# Patient Record
Sex: Male | Born: 1945 | Race: White | Hispanic: No | Marital: Single | Smoking: Former smoker
Health system: Southern US, Community
[De-identification: ages and names within clinical notes are randomized; demographics above are authoritative.]

## PROBLEM LIST (undated history)

## (undated) DIAGNOSIS — F32A Depression, unspecified: Secondary | ICD-10-CM

## (undated) DIAGNOSIS — K219 Gastro-esophageal reflux disease without esophagitis: Secondary | ICD-10-CM

## (undated) DIAGNOSIS — M1990 Unspecified osteoarthritis, unspecified site: Secondary | ICD-10-CM

## (undated) DIAGNOSIS — R0600 Dyspnea, unspecified: Secondary | ICD-10-CM

## (undated) DIAGNOSIS — I2510 Atherosclerotic heart disease of native coronary artery without angina pectoris: Secondary | ICD-10-CM

## (undated) DIAGNOSIS — G709 Myoneural disorder, unspecified: Secondary | ICD-10-CM

## (undated) DIAGNOSIS — G4730 Sleep apnea, unspecified: Secondary | ICD-10-CM

## (undated) DIAGNOSIS — J449 Chronic obstructive pulmonary disease, unspecified: Secondary | ICD-10-CM

## (undated) DIAGNOSIS — T8859XA Other complications of anesthesia, initial encounter: Secondary | ICD-10-CM

## (undated) DIAGNOSIS — F1910 Other psychoactive substance abuse, uncomplicated: Secondary | ICD-10-CM

## (undated) DIAGNOSIS — F4310 Post-traumatic stress disorder, unspecified: Secondary | ICD-10-CM

## (undated) DIAGNOSIS — E7800 Pure hypercholesterolemia, unspecified: Secondary | ICD-10-CM

## (undated) DIAGNOSIS — D649 Anemia, unspecified: Secondary | ICD-10-CM

## (undated) DIAGNOSIS — N189 Chronic kidney disease, unspecified: Secondary | ICD-10-CM

## (undated) DIAGNOSIS — C801 Malignant (primary) neoplasm, unspecified: Secondary | ICD-10-CM

## (undated) DIAGNOSIS — I10 Essential (primary) hypertension: Secondary | ICD-10-CM

## (undated) DIAGNOSIS — R413 Other amnesia: Secondary | ICD-10-CM

## (undated) HISTORY — PX: THROAT SURGERY: SHX803

## (undated) HISTORY — PX: CORONARY STENT PLACEMENT: SHX1402

## (undated) HISTORY — PX: COLONOSCOPY: SHX174

## (undated) HISTORY — PX: CORONARY ARTERY BYPASS GRAFT: SHX141

## (undated) HISTORY — PX: UPPER GI ENDOSCOPY: SHX6162

## (undated) HISTORY — PX: HAND SURGERY: SHX662

## (undated) HISTORY — PX: CARPAL TUNNEL RELEASE: SHX101

## (undated) HISTORY — PX: MOHS SURGERY: SHX181

---

## 1998-08-18 ENCOUNTER — Ambulatory Visit (HOSPITAL_COMMUNITY): Admission: RE | Admit: 1998-08-18 | Discharge: 1998-08-18 | Payer: Self-pay | Admitting: Internal Medicine

## 1998-09-05 ENCOUNTER — Ambulatory Visit (HOSPITAL_COMMUNITY): Admission: RE | Admit: 1998-09-05 | Discharge: 1998-09-05 | Payer: Self-pay | Admitting: Gastroenterology

## 1998-09-05 ENCOUNTER — Encounter: Payer: Self-pay | Admitting: Gastroenterology

## 1998-09-14 ENCOUNTER — Other Ambulatory Visit: Admission: RE | Admit: 1998-09-14 | Discharge: 1998-09-14 | Payer: Self-pay | Admitting: Gastroenterology

## 2000-12-10 DIAGNOSIS — I219 Acute myocardial infarction, unspecified: Secondary | ICD-10-CM

## 2000-12-10 HISTORY — DX: Acute myocardial infarction, unspecified: I21.9

## 2012-05-13 ENCOUNTER — Emergency Department (HOSPITAL_COMMUNITY): Payer: Non-veteran care

## 2012-05-13 ENCOUNTER — Observation Stay (HOSPITAL_COMMUNITY)
Admission: EM | Admit: 2012-05-13 | Discharge: 2012-05-14 | Disposition: A | Discharge status: Home / Self Care | Payer: Non-veteran care | Attending: Internal Medicine | Admitting: Internal Medicine

## 2012-05-13 ENCOUNTER — Encounter (HOSPITAL_COMMUNITY): Payer: Self-pay | Admitting: *Deleted

## 2012-05-13 ENCOUNTER — Observation Stay (HOSPITAL_COMMUNITY): Payer: Non-veteran care

## 2012-05-13 DIAGNOSIS — I10 Essential (primary) hypertension: Secondary | ICD-10-CM | POA: Insufficient documentation

## 2012-05-13 DIAGNOSIS — I509 Heart failure, unspecified: Secondary | ICD-10-CM | POA: Insufficient documentation

## 2012-05-13 DIAGNOSIS — R739 Hyperglycemia, unspecified: Secondary | ICD-10-CM

## 2012-05-13 DIAGNOSIS — R413 Other amnesia: Secondary | ICD-10-CM

## 2012-05-13 DIAGNOSIS — Z79899 Other long term (current) drug therapy: Secondary | ICD-10-CM | POA: Insufficient documentation

## 2012-05-13 DIAGNOSIS — Z951 Presence of aortocoronary bypass graft: Secondary | ICD-10-CM | POA: Insufficient documentation

## 2012-05-13 DIAGNOSIS — G9349 Other encephalopathy: Principal | ICD-10-CM | POA: Insufficient documentation

## 2012-05-13 DIAGNOSIS — Z7982 Long term (current) use of aspirin: Secondary | ICD-10-CM | POA: Insufficient documentation

## 2012-05-13 DIAGNOSIS — G9340 Encephalopathy, unspecified: Secondary | ICD-10-CM

## 2012-05-13 DIAGNOSIS — F17200 Nicotine dependence, unspecified, uncomplicated: Secondary | ICD-10-CM | POA: Insufficient documentation

## 2012-05-13 DIAGNOSIS — E7800 Pure hypercholesterolemia, unspecified: Secondary | ICD-10-CM | POA: Insufficient documentation

## 2012-05-13 DIAGNOSIS — E119 Type 2 diabetes mellitus without complications: Secondary | ICD-10-CM | POA: Insufficient documentation

## 2012-05-13 DIAGNOSIS — Z85828 Personal history of other malignant neoplasm of skin: Secondary | ICD-10-CM | POA: Insufficient documentation

## 2012-05-13 DIAGNOSIS — Z7902 Long term (current) use of antithrombotics/antiplatelets: Secondary | ICD-10-CM | POA: Insufficient documentation

## 2012-05-13 DIAGNOSIS — I2510 Atherosclerotic heart disease of native coronary artery without angina pectoris: Secondary | ICD-10-CM | POA: Insufficient documentation

## 2012-05-13 HISTORY — DX: Unspecified osteoarthritis, unspecified site: M19.90

## 2012-05-13 HISTORY — DX: Post-traumatic stress disorder, unspecified: F43.10

## 2012-05-13 HISTORY — DX: Malignant (primary) neoplasm, unspecified: C80.1

## 2012-05-13 HISTORY — DX: Atherosclerotic heart disease of native coronary artery without angina pectoris: I25.10

## 2012-05-13 HISTORY — DX: Essential (primary) hypertension: I10

## 2012-05-13 HISTORY — DX: Pure hypercholesterolemia, unspecified: E78.00

## 2012-05-13 LAB — COMPREHENSIVE METABOLIC PANEL
ALT: 43 U/L (ref 0–53)
AST: 25 U/L (ref 0–37)
Albumin: 3.6 g/dL (ref 3.5–5.2)
Alkaline Phosphatase: 84 U/L (ref 39–117)
CO2: 26 mEq/L (ref 19–32)
Chloride: 99 mEq/L (ref 96–112)
Creatinine, Ser: 1.19 mg/dL (ref 0.50–1.35)
GFR calc non Af Amer: 63 mL/min — ABNORMAL LOW (ref >90)
Potassium: 3.8 mEq/L (ref 3.5–5.1)
Total Bilirubin: 0.3 mg/dL (ref 0.3–1.2)

## 2012-05-13 LAB — RAPID URINE DRUG SCREEN, HOSP PERFORMED
Amphetamines: NOT DETECTED
Barbiturates: NOT DETECTED
Benzodiazepines: NOT DETECTED
Cocaine: NOT DETECTED
Tetrahydrocannabinol: NOT DETECTED

## 2012-05-13 LAB — CBC
HCT: 46.3 % (ref 39.0–52.0)
Hemoglobin: 16 g/dL (ref 13.0–17.0)
MCHC: 34.6 g/dL (ref 30.0–36.0)
RDW: 13.1 % (ref 11.5–15.5)
WBC: 4.8 10*3/uL (ref 4.0–10.5)

## 2012-05-13 LAB — URINALYSIS, ROUTINE W REFLEX MICROSCOPIC
Bilirubin Urine: NEGATIVE
Glucose, UA: 250 mg/dL — AB
Hgb urine dipstick: NEGATIVE
Ketones, ur: NEGATIVE mg/dL
Protein, ur: NEGATIVE mg/dL
Urobilinogen, UA: 1 mg/dL (ref 0.0–1.0)

## 2012-05-13 LAB — DIFFERENTIAL
Basophils Absolute: 0 10*3/uL (ref 0.0–0.1)
Basophils Relative: 0 % (ref 0–1)
Lymphocytes Relative: 24 % (ref 12–46)
Monocytes Absolute: 0.4 10*3/uL (ref 0.1–1.0)
Neutro Abs: 3.2 10*3/uL (ref 1.7–7.7)
Neutrophils Relative %: 66 % (ref 43–77)

## 2012-05-13 LAB — GLUCOSE, CAPILLARY
Glucose-Capillary: 256 mg/dL — ABNORMAL HIGH (ref 70–99)
Glucose-Capillary: 175 mg/dL — ABNORMAL HIGH (ref 70–99)

## 2012-05-13 LAB — POCT I-STAT TROPONIN I: Troponin i, poc: 0 ng/mL (ref 0.00–0.08)

## 2012-05-13 LAB — ETHANOL: Alcohol, Ethyl (B): <11 mg/dL (ref 0–11)

## 2012-05-13 MED ORDER — FUROSEMIDE 40 MG PO TABS
40.0000 mg | ORAL_TABLET | Freq: Every day | ORAL | Status: DC
  Administered 2012-05-14: 40 mg via ORAL
  Filled 2012-05-13: qty 1

## 2012-05-13 MED ORDER — TIOTROPIUM BROMIDE MONOHYDRATE 18 MCG IN CAPS
18.0000 ug | ORAL_CAPSULE | Freq: Every day | RESPIRATORY_TRACT | Status: DC
  Administered 2012-05-14: 18 ug via RESPIRATORY_TRACT
  Filled 2012-05-13: qty 5

## 2012-05-13 MED ORDER — ALBUTEROL SULFATE HFA 108 (90 BASE) MCG/ACT IN AERS
2.0000 | INHALATION_SPRAY | Freq: Four times a day (QID) | RESPIRATORY_TRACT | Status: DC | PRN
  Filled 2012-05-13: qty 6.7

## 2012-05-13 MED ORDER — PANTOPRAZOLE SODIUM 40 MG PO TBEC
40.0000 mg | DELAYED_RELEASE_TABLET | Freq: Every day | ORAL | Status: DC
  Administered 2012-05-14: 40 mg via ORAL
  Filled 2012-05-13: qty 1

## 2012-05-13 MED ORDER — ENOXAPARIN SODIUM 40 MG/0.4ML ~~LOC~~ SOLN
40.0000 mg | SUBCUTANEOUS | Status: DC
  Administered 2012-05-13: 40 mg via SUBCUTANEOUS
  Filled 2012-05-13 (×2): qty 0.4

## 2012-05-13 MED ORDER — CITALOPRAM HYDROBROMIDE 40 MG PO TABS
40.0000 mg | ORAL_TABLET | Freq: Every day | ORAL | Status: DC
  Administered 2012-05-14: 40 mg via ORAL
  Filled 2012-05-13: qty 1

## 2012-05-13 MED ORDER — SODIUM CHLORIDE 0.9 % IV SOLN: 250.0000 mL | INTRAVENOUS | Status: DC | PRN

## 2012-05-13 MED ORDER — SODIUM CHLORIDE 0.9 % IJ SOLN
3.0000 mL | Freq: Two times a day (BID) | INTRAMUSCULAR | Status: DC
  Administered 2012-05-13 – 2012-05-14 (×2): 3 mL via INTRAVENOUS

## 2012-05-13 MED ORDER — INSULIN GLARGINE 100 UNIT/ML ~~LOC~~ SOLN
80.0000 [IU] | Freq: Every day | SUBCUTANEOUS | Status: DC
  Administered 2012-05-13: 80 [IU] via SUBCUTANEOUS

## 2012-05-13 MED ORDER — ASPIRIN 81 MG PO CHEW
81.0000 mg | CHEWABLE_TABLET | Freq: Every day | ORAL | Status: DC
  Administered 2012-05-14: 81 mg via ORAL
  Filled 2012-05-13: qty 1

## 2012-05-13 MED ORDER — PRAZOSIN HCL 5 MG PO CAPS
6.0000 mg | ORAL_CAPSULE | Freq: Every day | ORAL | Status: DC
  Administered 2012-05-13: 6 mg via ORAL
  Filled 2012-05-13 (×2): qty 1

## 2012-05-13 MED ORDER — ISOSORBIDE MONONITRATE ER 30 MG PO TB24
30.0000 mg | ORAL_TABLET | Freq: Every day | ORAL | Status: DC
  Administered 2012-05-14: 30 mg via ORAL
  Filled 2012-05-13: qty 1

## 2012-05-13 MED ORDER — ACETAMINOPHEN 325 MG PO TABS
650.0000 mg | ORAL_TABLET | Freq: Once | ORAL | Status: AC
  Administered 2012-05-13: 650 mg via ORAL
  Filled 2012-05-13: qty 2

## 2012-05-13 MED ORDER — ADULT MULTIVITAMIN W/MINERALS CH
1.0000 | ORAL_TABLET | Freq: Every day | ORAL | Status: DC
  Administered 2012-05-14: 1 via ORAL
  Filled 2012-05-13: qty 1

## 2012-05-13 MED ORDER — LORATADINE 10 MG PO TABS
10.0000 mg | ORAL_TABLET | Freq: Every day | ORAL | Status: DC
  Administered 2012-05-14: 10 mg via ORAL
  Filled 2012-05-13: qty 1

## 2012-05-13 MED ORDER — ONDANSETRON HCL 4 MG/2ML IJ SOLN: 4.0000 mg | Freq: Four times a day (QID) | INTRAMUSCULAR | Status: DC | PRN

## 2012-05-13 MED ORDER — ATORVASTATIN CALCIUM 40 MG PO TABS
40.0000 mg | ORAL_TABLET | Freq: Every evening | ORAL | Status: DC
  Administered 2012-05-13: 40 mg via ORAL
  Filled 2012-05-13 (×2): qty 1

## 2012-05-13 MED ORDER — ACETAMINOPHEN 325 MG PO TABS: 650.0000 mg | ORAL_TABLET | ORAL | Status: DC | PRN

## 2012-05-13 MED ORDER — INSULIN ASPART 100 UNIT/ML ~~LOC~~ SOLN
0.0000 [IU] | Freq: Three times a day (TID) | SUBCUTANEOUS | Status: DC
  Administered 2012-05-14: 5 [IU] via SUBCUTANEOUS
  Administered 2012-05-14: 3 [IU] via SUBCUTANEOUS

## 2012-05-13 MED ORDER — CLOPIDOGREL BISULFATE 75 MG PO TABS
75.0000 mg | ORAL_TABLET | Freq: Every day | ORAL | Status: DC
  Administered 2012-05-14: 75 mg via ORAL
  Filled 2012-05-13 (×2): qty 1

## 2012-05-13 MED ORDER — METOPROLOL TARTRATE 12.5 MG HALF TABLET
12.5000 mg | ORAL_TABLET | Freq: Two times a day (BID) | ORAL | Status: DC
  Administered 2012-05-13 – 2012-05-14 (×2): 12.5 mg via ORAL
  Filled 2012-05-13 (×3): qty 1

## 2012-05-13 MED ORDER — SODIUM CHLORIDE 0.9 % IJ SOLN: 3.0000 mL | INTRAMUSCULAR | Status: DC | PRN

## 2012-05-13 MED ORDER — BUPROPION HCL ER (SR) 100 MG PO TB12
200.0000 mg | ORAL_TABLET | Freq: Two times a day (BID) | ORAL | Status: DC
  Administered 2012-05-13 – 2012-05-14 (×2): 200 mg via ORAL
  Filled 2012-05-13 (×3): qty 2

## 2012-05-13 MED ORDER — NITROGLYCERIN 0.4 MG SL SUBL: 0.4000 mg | SUBLINGUAL_TABLET | SUBLINGUAL | Status: DC | PRN
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
## 2012-05-13 NOTE — ED Notes (Signed)
CBG 256  °

## 2012-05-13 NOTE — ED Notes (Signed)
Taty[place], PA at bedside. 
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
## 2012-05-13 NOTE — ED Notes (Signed)
Patient transported to CT 

## 2012-05-14 ENCOUNTER — Encounter (HOSPITAL_COMMUNITY): Payer: Self-pay | Admitting: *Deleted

## 2012-05-14 DIAGNOSIS — G459 Transient cerebral ischemic attack, unspecified: Secondary | ICD-10-CM

## 2012-05-14 DIAGNOSIS — R413 Other amnesia: Secondary | ICD-10-CM

## 2012-05-14 DIAGNOSIS — I10 Essential (primary) hypertension: Secondary | ICD-10-CM

## 2012-05-14 DIAGNOSIS — G9340 Encephalopathy, unspecified: Secondary | ICD-10-CM

## 2012-05-14 LAB — LIPID PANEL
Cholesterol: 117 mg/dL (ref 0–200)
HDL: 31 mg/dL — ABNORMAL LOW (ref >39)
Total CHOL/HDL Ratio: 3.8 RATIO

## 2012-05-14 LAB — HEMOGLOBIN A1C
Hgb A1c MFr Bld: 7.4 % — ABNORMAL HIGH (ref <5.7)
Mean Plasma Glucose: 166 mg/dL — ABNORMAL HIGH (ref <117)

## 2012-05-14 LAB — GLUCOSE, CAPILLARY: Glucose-Capillary: 183 mg/dL — ABNORMAL HIGH (ref 70–99)

## 2012-05-14 NOTE — Progress Notes (Signed)
Pt's daughter called to confirm that pt did indeed take his PM medications this am. Ambien was apart of the nighttime medications that he took this morning per pt's daughter. Will continue to monitor pt.

## 2012-05-14 NOTE — Progress Notes (Signed)
  Echocardiogram 2D Echocardiogram has been performed.  Micheal Duncan A 05/14/2012, 2:24 PM

## 2012-05-14 NOTE — Care Management Note (Unsigned)
    Page 1 of 1   05/14/2012     2:12:17 PM   CARE MANAGEMENT NOTE 05/14/2012  Patient:  Micheal Duncan, Micheal Duncan   Account Number:  [id number]  Date Initiated:  05/14/2012  Documentation initiated by:  Lanier Clam  Subjective/Objective Assessment:   ADMITTED W/AMNESIA.     Action/Plan:   FROM HOME   Anticipated DC Date:  05/15/2012   Anticipated DC Plan:  HOME/SELF CARE      DC Planning Services  CM consult      Choice offered to / List presented to:             Status of service:  In process, will continue to follow Medicare Important Message given?   (If response is "NO", the following Medicare IM given date fields will be blank) Date Medicare IM given:   Date Additional Medicare IM given:    Discharge Disposition:    Per UR Regulation:  Reviewed for med. necessity/level of care/duration of stay  If discussed at Long Length of Stay Meetings, dates discussed:    Comments:  05/14/12 [facility] RN,BSN NCM [phone]
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
## 2012-05-14 NOTE — Progress Notes (Signed)
VASCULAR LAB PRELIMINARY  PRELIMINARY  PRELIMINARY  PRELIMINARY  Carotid duplex completed.    Preliminary report: Bilateral:  No evidence of hemodynamically significant internal carotid artery stenosis.   Vertebral artery flow is antegrade.     Terance Hart, RVT 05/14/2012, 1:44 PM

## 2012-05-14 NOTE — Progress Notes (Signed)
Patient discharged.

## 2012-05-17 NOTE — ED Provider Notes (Signed)
Medical screening examination/treatment/procedure(s) were conducted as a shared visit with non-physician practitioner(s) and myself.  I personally evaluated the patient during the encounter This male presents following a questionable syncopal episode.  The patient's history is difficult to interpret, and may have included either a syncopal or a sleeping episode.  Given these concerns, the absence of recent prior evaluation, the patient was admitted for further evaluation and management.  I have seen the ECG and agree with the interpretation  Gerhard Munch, MD 05/17/12 1550

## 2013-01-16 ENCOUNTER — Emergency Department (HOSPITAL_COMMUNITY): Payer: Non-veteran care

## 2013-01-16 ENCOUNTER — Emergency Department (HOSPITAL_COMMUNITY)
Admission: EM | Admit: 2013-01-16 | Discharge: 2013-01-16 | Disposition: A | Discharge status: Home / Self Care | Payer: Non-veteran care | Attending: Emergency Medicine | Admitting: Emergency Medicine

## 2013-01-16 ENCOUNTER — Encounter (HOSPITAL_COMMUNITY): Payer: Self-pay | Admitting: Emergency Medicine

## 2013-01-16 DIAGNOSIS — Z951 Presence of aortocoronary bypass graft: Secondary | ICD-10-CM | POA: Diagnosis not present

## 2013-01-16 DIAGNOSIS — I10 Essential (primary) hypertension: Secondary | ICD-10-CM | POA: Diagnosis not present

## 2013-01-16 DIAGNOSIS — Z85820 Personal history of malignant melanoma of skin: Secondary | ICD-10-CM | POA: Insufficient documentation

## 2013-01-16 DIAGNOSIS — R112 Nausea with vomiting, unspecified: Secondary | ICD-10-CM | POA: Insufficient documentation

## 2013-01-16 DIAGNOSIS — R799 Abnormal finding of blood chemistry, unspecified: Secondary | ICD-10-CM | POA: Diagnosis not present

## 2013-01-16 DIAGNOSIS — Z7902 Long term (current) use of antithrombotics/antiplatelets: Secondary | ICD-10-CM | POA: Diagnosis not present

## 2013-01-16 DIAGNOSIS — R197 Diarrhea, unspecified: Secondary | ICD-10-CM | POA: Insufficient documentation

## 2013-01-16 DIAGNOSIS — I2510 Atherosclerotic heart disease of native coronary artery without angina pectoris: Secondary | ICD-10-CM | POA: Insufficient documentation

## 2013-01-16 DIAGNOSIS — R1012 Left upper quadrant pain: Secondary | ICD-10-CM | POA: Insufficient documentation

## 2013-01-16 DIAGNOSIS — Z794 Long term (current) use of insulin: Secondary | ICD-10-CM | POA: Diagnosis not present

## 2013-01-16 DIAGNOSIS — Z7982 Long term (current) use of aspirin: Secondary | ICD-10-CM | POA: Diagnosis not present

## 2013-01-16 DIAGNOSIS — Z8739 Personal history of other diseases of the musculoskeletal system and connective tissue: Secondary | ICD-10-CM | POA: Insufficient documentation

## 2013-01-16 DIAGNOSIS — R109 Unspecified abdominal pain: Secondary | ICD-10-CM

## 2013-01-16 DIAGNOSIS — F4310 Post-traumatic stress disorder, unspecified: Secondary | ICD-10-CM | POA: Insufficient documentation

## 2013-01-16 DIAGNOSIS — F17200 Nicotine dependence, unspecified, uncomplicated: Secondary | ICD-10-CM | POA: Insufficient documentation

## 2013-01-16 DIAGNOSIS — Z79899 Other long term (current) drug therapy: Secondary | ICD-10-CM | POA: Insufficient documentation

## 2013-01-16 DIAGNOSIS — E119 Type 2 diabetes mellitus without complications: Secondary | ICD-10-CM | POA: Insufficient documentation

## 2013-01-16 DIAGNOSIS — Z9861 Coronary angioplasty status: Secondary | ICD-10-CM | POA: Diagnosis not present

## 2013-01-16 DIAGNOSIS — E7800 Pure hypercholesterolemia, unspecified: Secondary | ICD-10-CM | POA: Insufficient documentation

## 2013-01-16 LAB — CBC WITH DIFFERENTIAL/PLATELET
Eosinophils Absolute: 0.1 10*3/uL (ref 0.0–0.7)
Eosinophils Relative: 1 % (ref 0–5)
Lymphs Abs: 0.2 10*3/uL — ABNORMAL LOW (ref 0.7–4.0)
MCH: 31.5 pg (ref 26.0–34.0)
MCV: 89.7 fL (ref 78.0–100.0)
Platelets: 120 10*3/uL — ABNORMAL LOW (ref 150–400)
RBC: 5.55 MIL/uL (ref 4.22–5.81)
RDW: 13.6 % (ref 11.5–15.5)

## 2013-01-16 LAB — URINALYSIS, ROUTINE W REFLEX MICROSCOPIC
Leukocytes, UA: NEGATIVE
Nitrite: NEGATIVE
Protein, ur: NEGATIVE mg/dL
Urobilinogen, UA: 1 mg/dL (ref 0.0–1.0)

## 2013-01-16 LAB — COMPREHENSIVE METABOLIC PANEL
ALT: 43 U/L (ref 0–53)
BUN: 21 mg/dL (ref 6–23)
Calcium: 8.6 mg/dL (ref 8.4–10.5)
GFR calc Af Amer: 78 mL/min — ABNORMAL LOW (ref >90)
Glucose, Bld: 193 mg/dL — ABNORMAL HIGH (ref 70–99)
Sodium: 135 mEq/L (ref 135–145)
Total Protein: 6.6 g/dL (ref 6.0–8.3)

## 2013-01-16 LAB — AMYLASE: Amylase: 63 U/L (ref 0–105)

## 2013-01-16 LAB — PRO B NATRIURETIC PEPTIDE: Pro B Natriuretic peptide (BNP): 94.7 pg/mL (ref 0–125)

## 2013-01-16 LAB — LIPASE, BLOOD: Lipase: 33 U/L (ref 11–59)

## 2013-01-16 MED ORDER — SUCRALFATE 1 GM/10ML PO SUSP: 1.0000 g | Freq: Four times a day (QID) | ORAL | Status: DC

## 2013-01-16 MED ORDER — ONDANSETRON HCL 4 MG/2ML IJ SOLN
4.0000 mg | Freq: Once | INTRAMUSCULAR | Status: AC
  Administered 2013-01-16: 4 mg via INTRAVENOUS
  Filled 2013-01-16: qty 2

## 2013-01-16 MED ORDER — IOHEXOL 300 MG/ML  SOLN
100.0000 mL | Freq: Once | INTRAMUSCULAR | Status: AC | PRN
  Administered 2013-01-16: 100 mL via INTRAVENOUS

## 2013-01-16 MED ORDER — HYDROMORPHONE HCL PF 1 MG/ML IJ SOLN
1.0000 mg | Freq: Once | INTRAMUSCULAR | Status: AC
  Administered 2013-01-16: 1 mg via INTRAVENOUS
  Filled 2013-01-16: qty 1

## 2013-01-16 MED ORDER — IOHEXOL 300 MG/ML  SOLN
50.0000 mL | Freq: Once | INTRAMUSCULAR | Status: AC | PRN
  Administered 2013-01-16: 50 mL via ORAL

## 2013-01-16 NOTE — ED Provider Notes (Signed)
History     CSN: [id number]  Arrival date & time 01/16/13  1759   First MD Initiated Contact with Patient 01/16/13 1937      Chief Complaint  Patient presents with  . Abdominal Pain  . Abnormal Lab    (Consider location/radiation/quality/duration/timing/severity/associated sxs/prior treatment) HPI The patient presents after an one day of significant nausea, vomiting.  He states that over the past weeks he has developed increasing pain in his abdomen, focally about the left upper quadrant, with mild diffuse radiation.  The pain is worse with positioning.  Over the past 2 weeks he is developed nausea, the no significant emesis until today.  Today the patient had multiple episodes of copious vomiting, non-bloody.  He also had several episodes of diarrhea today. The patient denies fevers, chills, confusion, disorientation.  He notes that his abdomen has become increasingly large over the past few months. The patient receives primary care at a [facility], has no gastroenterologist locally. He also complains of ongoing fecal incontinence post defecation.   Past Medical History  Diagnosis Date  . Coronary artery disease   . Diabetes mellitus   . Hypertension   . Hypercholesteremia   . Arthritis   . PTSD (post-traumatic stress disorder)   . 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 Coronary artery bypass graft   . Coronary stent placement   . Hand surgery     for skin cancer  . Throat surgery   . Carpal tunnel release     right    Family History  Problem Relation Age of Onset  . Aneurysm Father     History  Substance Use Topics  . Smoking status: Current Every Day Smoker -- 0.5 packs/day for 45 years  . Smokeless tobacco: Never Used  . Alcohol Use: No      Review of Systems  Constitutional:       Per HPI, otherwise negative  HENT:       Per HPI, otherwise negative  Eyes: Negative.   Respiratory:       Per HPI,  otherwise negative  Cardiovascular:       Per HPI, otherwise negative  Gastrointestinal: Positive for nausea, vomiting, abdominal pain, diarrhea and abdominal distention. Negative for constipation, blood in stool, anal bleeding and rectal pain.  Genitourinary: Negative.   Musculoskeletal:       Per HPI, otherwise negative  Skin: Negative for rash and wound.  Neurological: Negative for syncope.    Allergies  Fentanyl  Home Medications   Current Outpatient Rx  Name  Route  Sig  Dispense  Refill  . ALBUTEROL SULFATE HFA 108 (90 BASE) MCG/ACT IN AERS   Inhalation   Inhale 2 puffs into the lungs 3 (three) times daily as needed. For shortness of breath         . ASPIRIN 81 MG PO CHEW   Oral   Chew 81 mg by mouth daily.         . ATORVASTATIN CALCIUM 40 MG PO TABS   Oral   Take 20 mg by mouth daily.          . CHLORHEXIDINE GLUCONATE 4 % EX LIQD   Topical   Apply 1 application topically daily as needed. For sore prevention         . CITALOPRAM HYDROBROMIDE 40 MG PO TABS   Oral   Take 20 mg by mouth daily.          Marland Kitchen  CLOPIDOGREL BISULFATE 75 MG PO TABS   Oral   Take 75 mg by mouth daily.         . FUROSEMIDE 40 MG PO TABS   Oral   Take 40 mg by mouth daily.         Marland Kitchen HYDROCORTISONE 2.5 % EX LOTN   Topical   Apply 1 application topically 2 (two) times daily as needed. Apply topically to left nipple to reduce inflammation         . INSULIN GLARGINE 100 UNIT/ML [place] SOLN   Subcutaneous   Inject 85 Units into the skin at bedtime.          . INSULIN REGULAR HUMAN 100 UNIT/ML IJ SOLN   Subcutaneous   Inject 46 Units into the skin 3 (three) times daily before meals.          . ISOSORBIDE MONONITRATE ER 30 MG PO TB24   Oral   Take 30 mg by mouth daily.         Marland Kitchen LISINOPRIL 10 MG PO TABS   Oral   Take 5 mg by mouth daily.          Marland Kitchen LORATADINE 10 MG PO TABS   Oral   Take 10 mg by mouth daily.         Marland Kitchen METOPROLOL TARTRATE 25 MG PO TABS    Oral   Take 12.5 mg by mouth 2 (two) times daily.         . ADULT MULTIVITAMIN W/MINERALS CH   Oral   Take 1 tablet by mouth daily.         Marland Kitchen NAPROXEN SODIUM 220 MG PO TABS   Oral   Take 440 mg by mouth daily as needed. For pain         . NITROGLYCERIN 0.4 MG SL SUBL   Sublingual   Place 0.4 mg under the tongue every 5 (five) minutes as needed. For chest pain         . OMEPRAZOLE 20 MG PO CPDR   Oral   Take 40 mg by mouth daily.         Marland Kitchen PRAZOSIN HCL 2 MG PO CAPS   Oral   Take 6 mg by mouth at bedtime.         Marland Kitchen TIOTROPIUM BROMIDE MONOHYDRATE 18 MCG IN CAPS   Inhalation   Place 18 mcg into inhaler and inhale daily as needed. For shortness of breath           BP 139/74  Pulse 101  Temp 98.3 F (36.8 C) (Oral)  Resp 22  SpO2 96%  Physical Exam  Nursing note and vitals reviewed. Constitutional: He is oriented to person, place, and time. He appears well-developed. No distress.  HENT:  Head: Normocephalic and atraumatic.  Eyes: Conjunctivae normal and EOM are normal.  Cardiovascular: Regular rhythm.  Tachycardia present.   Pulmonary/Chest: Effort normal. No stridor. No respiratory distress.  Abdominal: He exhibits no distension.       Protuberant abdomen, with minimal tenderness to palpation. There are multiple superficial scars, consistent with removal of superficial neoplasms  Musculoskeletal: He exhibits no edema.  Neurological: He is alert and oriented to person, place, and time.  Skin: Skin is warm and dry.  Psychiatric: He has a normal mood and affect.    ED Course  Procedures (including critical care time)  Labs Reviewed  CBC WITH DIFFERENTIAL - Abnormal; Notable for the following:    Hemoglobin  17.5 (*)     Platelets 120 (*)     Neutrophils Relative 89 (*)     Lymphocytes Relative 3 (*)     Lymphs Abs 0.2 (*)     All other components within normal limits  COMPREHENSIVE METABOLIC PANEL - Abnormal; Notable for the following:    Glucose,  Bld 193 (*)     GFR calc non Af Amer 67 (*)     GFR calc Af Amer 78 (*)     All other components within normal limits  LIPASE, BLOOD  AMYLASE  URINALYSIS, ROUTINE W REFLEX MICROSCOPIC  PRO B NATRIURETIC PEPTIDE   No results found.   No diagnosis found.  O2- 99%ra, normal  Update: Patient substantially better, no additional vomiting   Update: Stoma vomiting, patient appears calm, in no distress  I discussed all results with the patient and his daughter.  I reviewed prior imaging, including EGD, colonoscopy, labs from outside hospital. MDM  This patient presents with ongoing abdominal pain, and after an episode of nausea with vomiting.  On exam the patient is uncomfortable initially, but improved substantially.  Vital signs remained stable, and his labs, CAT scan did not demonstrate acute findings, but there are multiple abnormalities which were discussed with the patient and his family.  The patient is already arranging gastroenterology followup, and we'll have this within the next week.  There are multiple smooth forceps of nausea vomiting, with primary suspicion for gastric irritation, though biliary or ureteral spasm are considerations.        Gerhard Munch, MD 01/16/13 [phone]

## 2013-01-16 NOTE — ED Notes (Signed)
Pt sent here for PCP for increased pancreas enzymes; pt sts LUQ pain x months; pt sts worse with positioning; pt sts vomiting today

## 2013-01-16 NOTE — ED Notes (Signed)
Started drinking his contrast.

## 2013-01-16 NOTE — ED Notes (Signed)
Contrast completed.

## 2013-02-10 ENCOUNTER — Telehealth: Payer: Self-pay | Admitting: Internal Medicine

## 2013-02-10 NOTE — Telephone Encounter (Signed)
Pt dropped off his records from the [facility] to be reviewed. Both Dr. Rhea Belton and Dr. Marina Goodell declined to see the patient at this time. Called pt and informed him of the decision and he said he would come by the office to pickup his medical records.

## 2013-03-24 ENCOUNTER — Ambulatory Visit (INDEPENDENT_AMBULATORY_CARE_PROVIDER_SITE_OTHER): Payer: Medicare Other | Admitting: General Surgery

## 2013-03-24 ENCOUNTER — Encounter (INDEPENDENT_AMBULATORY_CARE_PROVIDER_SITE_OTHER): Payer: Self-pay | Admitting: General Surgery

## 2013-03-24 VITALS — BP 124/72 | HR 64 | Temp 97.3°F | Resp 16 | Ht 68.0 in | Wt 269.0 lb

## 2013-03-24 DIAGNOSIS — R159 Full incontinence of feces: Secondary | ICD-10-CM

## 2013-03-24 NOTE — Patient Instructions (Addendum)
Continue your fiber supplement twice a day.  Stop all stool softeners and laxatives.  Work on relaxing your pelvic muscles when you are trying to have a bowel movement.  Do not strain.      Bowel Incontinence  What is incontinence? Incontinence is the impaired ability to control gas or stool. Its severity ranges from mild difficulty with gas control to severe loss of control over liquid and formed stools. Incontinence to stool is a common problem, but often it is not discussed due to embarassment. What causes incontinence? There are many causes of incontinence. Injury during childbirth is one of the most common causes. These injuries may cause a tear in the anal muscles. The nerves supplying the anal muscles may also be injured. While some injuries may be recognized immediately following childbirth, many others may go unnoticed and not become a problem until later in life. In these situations, a prior childbirth may not be recognized as the cause of incontinence. Anal operations or traumatic injury to the tissue surrounding the anal region similarly can damage the anal muscles and hinder bowel control. Some individuals experience loss of strength in the anal muscles as they age. As a result, a minor control problem in a younger person may become more significant later in life. Diarrhea may be associated with a feeling of urgency or stool leakage due to the frequent liquid stools passing through the anal opening. If bleeding accompanies your bowel movements or you have lack of bowel control, consult your physician. These symptoms may indicate inflammation within the colon (colitis), a rectal tumor or rectal prolapse - all conditions that require prompt evaluation by a physician.    How is the cause of incontinence determined? An initial discussion of the problem with your physician will help establish the degree of control difficulty and its impact on your lifestyle. Many clues to the origin of  incontinence may be found in patient histories. For example, a woman's history of past childbirths is very important. Multiple pregnancies, large weight babies, forceps deliveries, or episiotomies may contribute to muscle or nerve injury at the time of childbirth. In some cases, medical illnesses and medications play a role in problems with control. A physical exam of the anal region should be performed. It may readily identify an obvious injury to the anal muscles. In addition, an ultrasound probe can be used within the anal area to provide a picture of the muscles and show areas in which the anal muscles have been injured. Frequently, additional studies are required to define the anal area more completely. In a test called anal manometry, a small catheter is placed into the anus to record pressure as patients relax and tighten the anal muscles. This test can demonstrate how weak or strong the muscle really is. A separate test may also be conducted to determine if the nerves that go to the anal muscles are functioning properly. What can be done to correct the problem? Treatment of incontinence may include: Marland Kitchen     Dietary changes .     Constipating medications .     Muscle strengthening exercises .     Biofeedback  Injectable bulking agents  Surgical muscle repair  Artificial anal sphincter  Sacral nerve stimulator  After a careful history, physical examination and testing to determine the cause and severity of the problem, treatment can be addressed. Mild problems may be treated very simply with dietary changes and the use of some constipating medications. Diseases which cause inflammation in the  rectum, such as colitis, may contribute to anal control problems. Treating these diseases also may eliminate or improve symptoms of incontinence. Sometimes a change in prescribed medications may help. Your physician also may recommend simple home exercises that may strengthen the anal muscles to help in  mild cases. A type of physical therapy called biofeedback can be used to help patients sense when stool is ready to be evacuated and help strengthen the muscles. Injuries to the anal muscles may be repaired with surgery. Some individuals may benefit from a technique that delivers electrical energy to the skin and muscles surrounding the anus which results in firming and thickening of this area to help with continence. In certain individuals that have nerve damage or anal muscles that are damaged beyond repair, an artificial sphincter may be implanted. The artificial sphincter is a plastic, fluid filled doughnut that is surgically implanted around the damaged anal sphincter. This artificial sphincter keeps the anal canal closed. When an individual wants to have a bowel movement, the fluid can be pumped out of the doughnut to allow the anal canal to open. In extreme cases, patients may find that a colostomy is the best option for improving their quality of life. What is a colon and rectal surgeon? Colon and rectal surgeons are experts in the surgical and non-surgical treatment of diseases of the colon, rectum and anus. They have completed advanced surgical training in the treatment of these diseases as well as full general surgical training. Board-certified colon and rectal surgeons complete residencies in general surgery and colon and rectal surgery, and pass intensive examinations conducted by the American Board of Surgery and the American Board of Colon and Rectal Surgery. They are well-versed in the treatment of both benign and malignant diseases of the colon, rectum and anus and are able to perform routine screening examinations and surgically treat conditions if indicated to do so.  2012 American Society of Colon & Rectal Surgeons

## 2013-03-24 NOTE — Progress Notes (Signed)
Chief Complaint  Patient presents with  . New Evaluation    eval of fecal incontinence    HISTORY: Micheal Duncan is a 67 y.o. male who presents to the office with fecal leakage for about a year and a half.  It is gradually getting worse.   He has tried Stool softeners  in the past with no success.  Diarrhea makes the symptoms worse.   It is intermittent in nature.  His bowel habits are regular and his bowel movements are soft.  His fiber intake is through a supplement.  His last colonoscopy was recently and a few polyps were removed.   He is continent to gas.  He has a feeling of incomplete evacuation.   Past Medical History  Diagnosis Date  . Coronary artery disease   . Diabetes mellitus   . Hypertension   . Hypercholesteremia   . Arthritis   . PTSD (post-traumatic stress disorder)   . 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 Coronary artery bypass graft    . Coronary stent placement    . Hand surgery      for skin cancer  . Throat surgery    . Carpal tunnel release      right        Current Outpatient Prescriptions  Medication Sig Dispense Refill  . albuterol (PROVENTIL HFA;VENTOLIN HFA) 108 (90 BASE) MCG/ACT inhaler Inhale 2 puffs into the lungs 3 (three) times daily as needed. For shortness of breath      . aspirin 81 MG chewable tablet Chew 81 mg by mouth daily.      Marland Kitchen atorvastatin (LIPITOR) 40 MG tablet Take 20 mg by mouth daily.       . chlorhexidine (HIBICLENS) 4 % external liquid Apply 1 application topically daily as needed. For sore prevention      . citalopram (CELEXA) 40 MG tablet Take 20 mg by mouth daily.       . clopidogrel (PLAVIX) 75 MG tablet Take 75 mg by mouth daily.      . furosemide (LASIX) 40 MG tablet Take 40 mg by mouth daily.      . hydrocortisone 2.5 % lotion Apply 1 application topically 2 (two) times daily as needed. Apply topically to left nipple to reduce inflammation      . insulin  glargine (LANTUS) 100 UNIT/ML injection Inject 85 Units into the skin at bedtime.       . insulin regular (NOVOLIN R,HUMULIN R) 100 units/mL injection Inject 46 Units into the skin 3 (three) times daily before meals.       . isosorbide mononitrate (IMDUR) 30 MG 24 hr tablet Take 30 mg by mouth daily.      Marland Kitchen lisinopril (PRINIVIL,ZESTRIL) 10 MG tablet Take 5 mg by mouth daily.       Marland Kitchen loratadine (CLARITIN) 10 MG tablet Take 10 mg by mouth daily.      . metoprolol tartrate (LOPRESSOR) 25 MG tablet Take 12.5 mg by mouth 2 (two) times daily.      . Multiple Vitamin (MULITIVITAMIN WITH MINERALS) TABS Take 1 tablet by mouth daily.      . naproxen sodium (ANAPROX) 220 MG tablet Take 440 mg by mouth daily as needed. For pain      . nitroGLYCERIN (NITROSTAT) 0.4 MG SL tablet Place 0.4 mg under the tongue every 5 (five) minutes as needed. For  chest pain      . omeprazole (PRILOSEC) 20 MG capsule Take 40 mg by mouth daily.      . prazosin (MINIPRESS) 2 MG capsule Take 6 mg by mouth at bedtime.      . sucralfate (CARAFATE) 1 GM/10ML suspension Take 10 mLs (1 g total) by mouth 4 (four) times daily.  420 mL  0  . tiotropium (SPIRIVA) 18 MCG inhalation capsule Place 18 mcg into inhaler and inhale daily as needed. For shortness of breath       No current facility-administered medications for this visit.      Allergies  Allergen Reactions  . Fentanyl Itching    Confusion, "skin crawling"      Family History  Problem Relation Age of Onset  . Aneurysm Father   . Diabetes Mother   . Cancer Maternal Uncle     unknown to pt  . Cancer Maternal Grandmother     ?stomach  . Cancer Sister     ?stomach  . Cancer Maternal Uncle     unknown to pt  . Cancer Maternal Uncle     unknown to pt    History   Social History  . Marital Status: Single    Spouse Name: N/A    Number of Children: N/A  . Years of Education: N/A   Social History Main Topics  . Smoking status: Current Every Day Smoker -- 0.50  packs/day for 45 years  . Smokeless tobacco: Never Used  . Alcohol Use: No  . Drug Use: No  . Sexually Active: None   Other Topics Concern  . None   Social History Narrative  . None      REVIEW OF SYSTEMS - PERTINENT POSITIVES ONLY: Review of Systems - General ROS: negative for - chills, fever or weight loss Hematological and Lymphatic ROS: negative for - bleeding problems, blood clots or bruising Respiratory ROS: no cough, shortness of breath, or wheezing Cardiovascular ROS: no chest pain or dyspnea on exertion Gastrointestinal ROS: positive for - abdominal pain and gas/bloating negative for - constipation or melena Genito-Urinary ROS: no dysuria, trouble voiding, or hematuria  EXAM: Filed Vitals:   03/24/13 0858  BP: 124/72  Pulse: 64  Temp: 97.3 F (36.3 C)  Resp: 16    General appearance: alert and cooperative Resp: clear to auscultation bilaterally Cardio: regular rate and rhythm GI: soft, non-tender; bowel sounds normal; no masses,  no organomegaly   Procedure: Anoscopy Surgeon: Maisie Fus Diagnosis: fecal leakage  Assistant: Christella Scheuermann After the risks and benefits were explained, verbal consent was obtained for above procedure  Anesthesia: none Findings: minimal squeeze pressure, uncoordinated push (muscles contract instead of relaxing), adequate resting tone, small perianal skin tag    ASSESSMENT AND PLAN:  Micheal Duncan is a 67 y.o. M with fecal incontinence to liquid stool. On exam he has a weak squeeze pressure. I think he would benefit from anorectal manometry and possibly biofeedback, given his failure to relax while pushing. He definitely needs to be on a fiber supplement without stool softeners and laxatives as well. I have asked him to work on relaxing his muscles to have a bowel movement instead of straining.  We will attempt to get his anorectal manometry set up in the next few weeks.        Vanita Panda, MD Colon and Rectal Surgery / General  Surgery [facility]      Visit Diagnoses: 1. Fecal incontinence  Primary Care Physician: Default, Provider, MD

## 2013-04-06 ENCOUNTER — Ambulatory Visit (HOSPITAL_COMMUNITY)
Admission: RE | Admit: 2013-04-06 | Discharge: 2013-04-06 | Disposition: A | Discharge status: Home / Self Care | Payer: Medicare Other | Source: Ambulatory Visit | Attending: General Surgery | Admitting: General Surgery

## 2013-04-06 ENCOUNTER — Encounter (HOSPITAL_COMMUNITY)
Admission: RE | Disposition: A | Discharge status: Home / Self Care | Payer: Self-pay | Source: Ambulatory Visit | Attending: General Surgery

## 2013-04-06 DIAGNOSIS — R159 Full incontinence of feces: Secondary | ICD-10-CM

## 2013-04-06 SURGERY — MANOMETRY, ANORECTAL

## 2013-06-23 ENCOUNTER — Ambulatory Visit (INDEPENDENT_AMBULATORY_CARE_PROVIDER_SITE_OTHER): Payer: Medicare Other | Admitting: General Surgery

## 2013-06-23 ENCOUNTER — Encounter (INDEPENDENT_AMBULATORY_CARE_PROVIDER_SITE_OTHER): Payer: Self-pay | Admitting: General Surgery

## 2013-06-23 VITALS — BP 118/60 | HR 84 | Resp 20 | Ht 68.0 in | Wt 262.0 lb

## 2013-06-23 DIAGNOSIS — K6289 Other specified diseases of anus and rectum: Secondary | ICD-10-CM

## 2013-06-23 NOTE — Patient Instructions (Signed)
Continue current medications.  If you develop issues again, restart the fiber supplement.  If this does not work, give [place] a call for a follow up apt.

## 2013-06-23 NOTE — Progress Notes (Signed)
Micheal Duncan is a 67 y.o. male who is here for a follow up visit regarding his fecal incontinence.  This has improved on the stool softeners.  He has also been doing some pelvic floor exercises.  He has stopped his fiber supplement.    Objective: Filed Vitals:   06/23/13 1110  BP: 118/60  Pulse: 84  Resp: 20    General appearance: alert and cooperative GI: soft, non-tender; bowel sounds normal; no masses,  no organomegaly  Manometry results reviewed with pt.  His sphincters appeared rather normal, but he did have some relaxation issues with pushing  Assessment and Plan: Micheal Duncan is a 67 y.o. M who seems to be doing better.  I believe his incontinence was most related to constipation.  He will continue his current therapy.  If he has trouble again, he will restart his fiber supplement.  If this does not work, he will call us to be re-evaluated.      Vanita Panda, MD [facility], [place] [phone]

## 2015-01-13 ENCOUNTER — Encounter: Payer: Self-pay | Admitting: Podiatry

## 2015-01-13 ENCOUNTER — Ambulatory Visit (INDEPENDENT_AMBULATORY_CARE_PROVIDER_SITE_OTHER): Payer: Medicare Other

## 2015-01-13 ENCOUNTER — Ambulatory Visit (INDEPENDENT_AMBULATORY_CARE_PROVIDER_SITE_OTHER): Payer: Medicare Other | Admitting: Podiatry

## 2015-01-13 VITALS — BP 147/57 | HR 61 | Resp 18 | Ht 68.0 in | Wt 262.0 lb

## 2015-01-13 DIAGNOSIS — M7751 Other enthesopathy of right foot: Secondary | ICD-10-CM

## 2015-01-13 DIAGNOSIS — E119 Type 2 diabetes mellitus without complications: Secondary | ICD-10-CM

## 2015-01-13 DIAGNOSIS — M779 Enthesopathy, unspecified: Secondary | ICD-10-CM

## 2015-01-13 NOTE — Patient Instructions (Addendum)
Diabetes and Foot Care Diabetes may cause you to have problems because of poor blood supply (circulation) to your feet and legs. This may cause the skin on your feet to become thinner, break easier, and heal more slowly. Your skin may become dry, and the skin may peel and crack. You may also have nerve damage in your legs and feet causing decreased feeling in them. You may not notice minor injuries to your feet that could lead to infections or more serious problems. Taking care of your feet is one of the most important things you can do for yourself.  HOME CARE INSTRUCTIONS  Wear shoes at all times, even in the house. Do not go barefoot. Bare feet are easily injured.  Check your feet daily for blisters, cuts, and redness. If you cannot see the bottom of your feet, use a mirror or ask someone for help.  Wash your feet with warm water (do not use hot water) and mild soap. Then pat your feet and the areas between your toes until they are completely dry. Do not soak your feet as this can dry your skin.  Apply a moisturizing lotion or petroleum jelly (that does not contain alcohol and is unscented) to the skin on your feet and to dry, brittle toenails. Do not apply lotion between your toes.  Trim your toenails straight across. Do not dig under them or around the cuticle. File the edges of your nails with an emery board or nail file.  Do not cut corns or calluses or try to remove them with medicine.  Wear clean socks or stockings every day. Make sure they are not too tight. Do not wear knee-high stockings since they may decrease blood flow to your legs.  Wear shoes that fit properly and have enough cushioning. To break in new shoes, wear them for just a few hours a day. This prevents you from injuring your feet. Always look in your shoes before you put them on to be sure there are no objects inside.  Do not cross your legs. This may decrease the blood flow to your feet.  If you find a minor scrape,  cut, or break in the skin on your feet, keep it and the skin around it clean and dry. These areas may be cleansed with mild soap and water. Do not cleanse the area with peroxide, alcohol, or iodine.  When you remove an adhesive bandage, be sure not to damage the skin around it.  If you have a wound, look at it several times a day to make sure it is healing.  Do not use heating pads or hot water bottles. They may burn your skin. If you have lost feeling in your feet or legs, you may not know it is happening until it is too late.  Make sure your health care provider performs a complete foot exam at least annually or more often if you have foot problems. Report any cuts, sores, or bruises to your health care provider immediately. SEEK MEDICAL CARE IF:   You have an injury that is not healing.  You have cuts or breaks in the skin.  You have an ingrown nail.  You notice redness on your legs or feet.  You feel burning or tingling in your legs or feet.  You have pain or cramps in your legs and feet.  Your legs or feet are numb.  Your feet always feel cold. SEEK IMMEDIATE MEDICAL CARE IF:   There is increasing redness,   swelling, or pain in or around a wound.  There is a red line that goes up your leg.  Pus is coming from a wound.  You develop a fever or as directed by your health care provider.  You notice a bad smell coming from an ulcer or wound. Document Released: 11/23/2000 Document Revised: 07/29/2013 Document Reviewed: 05/05/2013 [facility] Patient Information 2015 [place], [place]. This information is not intended to replace advice given to you by your health care provider. Make sure you discuss any questions you have with your health care provider. Pre-Operative Instructions  Congratulations, you have decided to take an important step to improving your quality of life.  You can be assured that the doctors of [place] will be with you every step of the way.  Plan to be  at the surgery center/hospital at least 1 (one) hour prior to your scheduled time unless otherwise directed by the surgical center/hospital staff.  You must have a responsible adult accompany you, remain during the surgery and drive you home.  Make sure you have directions to the surgical center/hospital and know how to get there on time. For hospital based surgery you will need to obtain a history and physical form from your family physician within 1 month prior to the date of surgery- we will give you a form for you primary physician.  We make every effort to accommodate the date you request for surgery.  There are however, times where surgery dates or times have to be moved.  We will contact you as soon as possible if a change in schedule is required.   No Aspirin/Ibuprofen for one week before surgery.  If you are on aspirin, any non-steroidal anti-inflammatory medications (Mobic, Aleve, Ibuprofen) you should stop taking it 7 days prior to your surgery.  You make take Tylenol  For pain prior to surgery.  Medications- If you are taking daily heart and blood pressure medications, seizure, reflux, allergy, asthma, anxiety, pain or diabetes medications, make sure the surgery center/hospital is aware before the day of surgery so they may notify you which medications to take or avoid the day of surgery. No food or drink after midnight the night before surgery unless directed otherwise by surgical center/hospital staff. No alcoholic beverages 24 hours prior to surgery.  No smoking 24 hours prior to or 24 hours after surgery. Wear loose pants or shorts- loose enough to fit over bandages, boots, and casts. No slip on shoes, sneakers are best. Bring your boot with you to the surgery center/hospital.  Also bring crutches or a walker if your physician has prescribed it for you.  If you do not have this equipment, it will be provided for you after surgery. If you have not been contracted by the surgery  center/hospital by the day before your surgery, call to confirm the date and time of your surgery. Leave-time from work may vary depending on the type of surgery you have.  Appropriate arrangements should be made prior to surgery with your employer. Prescriptions will be provided immediately following surgery by your doctor.  Have these filled as soon as possible after surgery and take the medication as directed. Remove nail polish on the operative foot. Wash the night before surgery.  The night before surgery wash the foot and leg well with the antibacterial soap provided and water paying special attention to beneath the toenails and in between the toes.  Rinse thoroughly with water and dry well with a towel.  Perform this  wash unless told not to do so by your physician.  Enclosed: 1 Ice pack (please put in freezer the night before surgery)   1 Hibiclens skin cleaner   Pre-op Instructions  If you have any questions regarding the instructions, do not hesitate to call our office.  [place]: [address], [place] [address] [place]: [address], [place], [place] [address] [phone]  [place]: [address], [place] [address] [phone]  Dr. Kendell Bane DPM, Dr. Ila Mcgill DPM Dr. Harriet Masson DPM, Dr. Lanelle Bal DPM, Dr. Trudie Buckler DPM

## 2015-01-13 NOTE — Progress Notes (Signed)
   Subjective:    Patient ID: Micheal Duncan, male    DOB: [birth year], 69 y.o.   MRN: [id number]  HPI Comments: Knot on foot, knot has been there for 10 years , it got irritated with a pair of shoes that i wore , place is sore . No drainage.  Diabetes since 89 , last a1c was 7.1   Toe Pain       Review of Systems  Endocrine:       Diabetes  All other systems reviewed and are negative.      Objective:   Physical Exam: I have reviewed his past medical history medications allergies surgery social history and review of systems. Pulses are strongly palpable bilateral. Neurologic sensorium is decreased percent lasting monofilament. Deep tendon reflectors are intact and brisk bilateral. Muscle strength is 5 over 5 dorsiflexion plantar flexors and inverters everters on physical musculature is intact. Orthopedic evaluation was resolved with distal ankle full range of motion without crepitus the exception of a nonpulsatile mass to the dorsal medial aspect of the first metatarsophalangeal joint of the right foot. This measures a little greater than 1 cm in diameter with some blood beneath the skin. I see no skin breakdown as of yet the lesion is firm and immovable. Radiographic evaluation does demonstrate beneath the skin more than likely associated with trauma.      Assessment & Plan:  Assessment: Diabetes mellitus with diabetic peripheral neuropathy. A history of cardiovascular disease. I also think he has a spur that is associated with trauma to the first metatarsophalangeal joint of the right foot.  Plan: Discussed etiology pathology conservative versus surgical therapies at this point he is going have to get medical clearance from the [facility] his first cardiology and PCP. We went over consent form today line by line number by number giving him ample time to ask questions he sulfate regarding a exostectomy first metatarsal head of the right foot. I answered all questions regarding this  procedure to the best of my ability in layman's terms he understood it was amenable to it and signed all 3 cages of the consent form. We will await the clearance from his primary doctor and cardiologist and then schedule him for surgical excision.

## 2015-01-21 ENCOUNTER — Encounter: Payer: Self-pay | Admitting: *Deleted

## 2015-01-21 ENCOUNTER — Telehealth: Payer: Self-pay | Admitting: *Deleted

## 2015-01-21 NOTE — Telephone Encounter (Signed)
Medical clearance letters were faxed to Dr. Benay Pike and Dr. Andree Elk.

## 2015-01-21 NOTE — Telephone Encounter (Signed)
Pt asked if we had received the information from the [place].

## 2015-02-01 ENCOUNTER — Telehealth: Payer: Self-pay | Admitting: *Deleted

## 2015-02-01 NOTE — Telephone Encounter (Signed)
Dr. Gaspar Bidding sent a response to medical clearance request.  She stated the patient has a Cardiology appointment on 02/08/2015 for pre-op evaluation.

## 2017-10-09 DIAGNOSIS — Z23 Encounter for immunization: Secondary | ICD-10-CM | POA: Diagnosis not present

## 2018-01-06 DIAGNOSIS — M170 Bilateral primary osteoarthritis of knee: Secondary | ICD-10-CM | POA: Diagnosis not present

## 2018-01-06 DIAGNOSIS — M1712 Unilateral primary osteoarthritis, left knee: Secondary | ICD-10-CM | POA: Diagnosis not present

## 2018-01-06 DIAGNOSIS — M25561 Pain in right knee: Secondary | ICD-10-CM | POA: Diagnosis not present

## 2018-01-06 DIAGNOSIS — M25562 Pain in left knee: Secondary | ICD-10-CM | POA: Diagnosis not present

## 2018-01-14 DIAGNOSIS — M1712 Unilateral primary osteoarthritis, left knee: Secondary | ICD-10-CM | POA: Diagnosis not present

## 2018-01-14 DIAGNOSIS — M25562 Pain in left knee: Secondary | ICD-10-CM | POA: Diagnosis not present

## 2018-01-21 DIAGNOSIS — M25562 Pain in left knee: Secondary | ICD-10-CM | POA: Diagnosis not present

## 2018-01-21 DIAGNOSIS — M1712 Unilateral primary osteoarthritis, left knee: Secondary | ICD-10-CM | POA: Diagnosis not present

## 2018-01-28 DIAGNOSIS — M25561 Pain in right knee: Secondary | ICD-10-CM | POA: Diagnosis not present

## 2018-01-28 DIAGNOSIS — M1711 Unilateral primary osteoarthritis, right knee: Secondary | ICD-10-CM | POA: Diagnosis not present

## 2018-02-04 DIAGNOSIS — M1711 Unilateral primary osteoarthritis, right knee: Secondary | ICD-10-CM | POA: Diagnosis not present

## 2018-02-04 DIAGNOSIS — M25561 Pain in right knee: Secondary | ICD-10-CM | POA: Diagnosis not present

## 2018-02-11 DIAGNOSIS — M1711 Unilateral primary osteoarthritis, right knee: Secondary | ICD-10-CM | POA: Diagnosis not present

## 2018-02-11 DIAGNOSIS — M25561 Pain in right knee: Secondary | ICD-10-CM | POA: Diagnosis not present

## 2019-02-07 DIAGNOSIS — J411 Mucopurulent chronic bronchitis: Secondary | ICD-10-CM | POA: Diagnosis not present

## 2019-09-09 DIAGNOSIS — Z23 Encounter for immunization: Secondary | ICD-10-CM | POA: Diagnosis not present

## 2019-12-31 DIAGNOSIS — M25561 Pain in right knee: Secondary | ICD-10-CM | POA: Diagnosis not present

## 2019-12-31 DIAGNOSIS — M170 Bilateral primary osteoarthritis of knee: Secondary | ICD-10-CM | POA: Diagnosis not present

## 2019-12-31 DIAGNOSIS — M25562 Pain in left knee: Secondary | ICD-10-CM | POA: Diagnosis not present

## 2020-01-07 DIAGNOSIS — M170 Bilateral primary osteoarthritis of knee: Secondary | ICD-10-CM | POA: Diagnosis not present

## 2020-01-07 DIAGNOSIS — M25561 Pain in right knee: Secondary | ICD-10-CM | POA: Diagnosis not present

## 2020-01-07 DIAGNOSIS — M25562 Pain in left knee: Secondary | ICD-10-CM | POA: Diagnosis not present

## 2020-01-14 DIAGNOSIS — M25562 Pain in left knee: Secondary | ICD-10-CM | POA: Diagnosis not present

## 2020-01-14 DIAGNOSIS — M170 Bilateral primary osteoarthritis of knee: Secondary | ICD-10-CM | POA: Diagnosis not present

## 2020-01-14 DIAGNOSIS — M25561 Pain in right knee: Secondary | ICD-10-CM | POA: Diagnosis not present

## 2020-01-21 DIAGNOSIS — M170 Bilateral primary osteoarthritis of knee: Secondary | ICD-10-CM | POA: Diagnosis not present

## 2020-01-21 DIAGNOSIS — M25562 Pain in left knee: Secondary | ICD-10-CM | POA: Diagnosis not present

## 2020-01-21 DIAGNOSIS — M25561 Pain in right knee: Secondary | ICD-10-CM | POA: Diagnosis not present

## 2020-06-17 DIAGNOSIS — M25562 Pain in left knee: Secondary | ICD-10-CM | POA: Diagnosis not present

## 2020-06-17 DIAGNOSIS — M25561 Pain in right knee: Secondary | ICD-10-CM | POA: Diagnosis not present

## 2020-06-17 DIAGNOSIS — M170 Bilateral primary osteoarthritis of knee: Secondary | ICD-10-CM | POA: Diagnosis not present

## 2020-07-22 DIAGNOSIS — M170 Bilateral primary osteoarthritis of knee: Secondary | ICD-10-CM | POA: Diagnosis not present

## 2020-07-22 DIAGNOSIS — M25561 Pain in right knee: Secondary | ICD-10-CM | POA: Diagnosis not present

## 2020-07-22 DIAGNOSIS — M25562 Pain in left knee: Secondary | ICD-10-CM | POA: Diagnosis not present

## 2020-07-28 DIAGNOSIS — M25562 Pain in left knee: Secondary | ICD-10-CM | POA: Diagnosis not present

## 2020-07-28 DIAGNOSIS — M170 Bilateral primary osteoarthritis of knee: Secondary | ICD-10-CM | POA: Diagnosis not present

## 2020-07-28 DIAGNOSIS — M25561 Pain in right knee: Secondary | ICD-10-CM | POA: Diagnosis not present

## 2020-08-04 DIAGNOSIS — M25561 Pain in right knee: Secondary | ICD-10-CM | POA: Diagnosis not present

## 2020-08-04 DIAGNOSIS — M25562 Pain in left knee: Secondary | ICD-10-CM | POA: Diagnosis not present

## 2020-08-04 DIAGNOSIS — M170 Bilateral primary osteoarthritis of knee: Secondary | ICD-10-CM | POA: Diagnosis not present

## 2020-08-11 DIAGNOSIS — M25562 Pain in left knee: Secondary | ICD-10-CM | POA: Diagnosis not present

## 2020-08-11 DIAGNOSIS — M170 Bilateral primary osteoarthritis of knee: Secondary | ICD-10-CM | POA: Diagnosis not present

## 2020-08-11 DIAGNOSIS — M25561 Pain in right knee: Secondary | ICD-10-CM | POA: Diagnosis not present

## 2020-08-18 DIAGNOSIS — M25561 Pain in right knee: Secondary | ICD-10-CM | POA: Diagnosis not present

## 2020-08-18 DIAGNOSIS — M25562 Pain in left knee: Secondary | ICD-10-CM | POA: Diagnosis not present

## 2020-08-18 DIAGNOSIS — M170 Bilateral primary osteoarthritis of knee: Secondary | ICD-10-CM | POA: Diagnosis not present

## 2020-11-21 DIAGNOSIS — R051 Acute cough: Secondary | ICD-10-CM | POA: Diagnosis not present

## 2020-11-21 DIAGNOSIS — J189 Pneumonia, unspecified organism: Secondary | ICD-10-CM | POA: Diagnosis not present

## 2020-11-21 DIAGNOSIS — Z20828 Contact with and (suspected) exposure to other viral communicable diseases: Secondary | ICD-10-CM | POA: Diagnosis not present

## 2021-02-06 DIAGNOSIS — J449 Chronic obstructive pulmonary disease, unspecified: Secondary | ICD-10-CM | POA: Diagnosis not present

## 2021-02-06 DIAGNOSIS — Z20828 Contact with and (suspected) exposure to other viral communicable diseases: Secondary | ICD-10-CM | POA: Diagnosis not present

## 2021-02-06 DIAGNOSIS — J324 Chronic pansinusitis: Secondary | ICD-10-CM | POA: Diagnosis not present

## 2021-08-13 DIAGNOSIS — H1033 Unspecified acute conjunctivitis, bilateral: Secondary | ICD-10-CM | POA: Diagnosis not present

## 2021-11-09 DIAGNOSIS — J189 Pneumonia, unspecified organism: Secondary | ICD-10-CM

## 2021-11-09 HISTORY — DX: Pneumonia, unspecified organism: J18.9

## 2021-11-23 DIAGNOSIS — J449 Chronic obstructive pulmonary disease, unspecified: Secondary | ICD-10-CM | POA: Diagnosis not present

## 2021-12-06 DIAGNOSIS — D125 Benign neoplasm of sigmoid colon: Secondary | ICD-10-CM | POA: Diagnosis not present

## 2021-12-10 DIAGNOSIS — I639 Cerebral infarction, unspecified: Secondary | ICD-10-CM

## 2021-12-10 HISTORY — DX: Cerebral infarction, unspecified: I63.9

## 2022-02-22 DIAGNOSIS — M25561 Pain in right knee: Secondary | ICD-10-CM | POA: Diagnosis not present

## 2022-02-22 DIAGNOSIS — M25562 Pain in left knee: Secondary | ICD-10-CM | POA: Diagnosis not present

## 2022-02-22 DIAGNOSIS — M170 Bilateral primary osteoarthritis of knee: Secondary | ICD-10-CM | POA: Diagnosis not present

## 2022-02-28 DIAGNOSIS — M25562 Pain in left knee: Secondary | ICD-10-CM | POA: Diagnosis not present

## 2022-02-28 DIAGNOSIS — M170 Bilateral primary osteoarthritis of knee: Secondary | ICD-10-CM | POA: Diagnosis not present

## 2022-02-28 DIAGNOSIS — M25561 Pain in right knee: Secondary | ICD-10-CM | POA: Diagnosis not present

## 2022-03-08 DIAGNOSIS — M25562 Pain in left knee: Secondary | ICD-10-CM | POA: Diagnosis not present

## 2022-03-08 DIAGNOSIS — M170 Bilateral primary osteoarthritis of knee: Secondary | ICD-10-CM | POA: Diagnosis not present

## 2022-03-08 DIAGNOSIS — M25561 Pain in right knee: Secondary | ICD-10-CM | POA: Diagnosis not present

## 2022-03-15 DIAGNOSIS — M170 Bilateral primary osteoarthritis of knee: Secondary | ICD-10-CM | POA: Diagnosis not present

## 2022-03-15 DIAGNOSIS — M25562 Pain in left knee: Secondary | ICD-10-CM | POA: Diagnosis not present

## 2022-03-15 DIAGNOSIS — M25561 Pain in right knee: Secondary | ICD-10-CM | POA: Diagnosis not present

## 2022-03-22 DIAGNOSIS — M25561 Pain in right knee: Secondary | ICD-10-CM | POA: Diagnosis not present

## 2022-03-22 DIAGNOSIS — M25562 Pain in left knee: Secondary | ICD-10-CM | POA: Diagnosis not present

## 2022-03-22 DIAGNOSIS — M170 Bilateral primary osteoarthritis of knee: Secondary | ICD-10-CM | POA: Diagnosis not present

## 2022-05-24 ENCOUNTER — Other Ambulatory Visit: Payer: Self-pay | Admitting: Neurosurgery

## 2022-05-24 ENCOUNTER — Other Ambulatory Visit (HOSPITAL_COMMUNITY): Payer: Self-pay | Admitting: Neurosurgery

## 2022-05-24 DIAGNOSIS — G912 (Idiopathic) normal pressure hydrocephalus: Secondary | ICD-10-CM

## 2022-05-28 ENCOUNTER — Encounter (HOSPITAL_COMMUNITY): Payer: Self-pay

## 2022-05-28 ENCOUNTER — Ambulatory Visit (HOSPITAL_COMMUNITY)
Admission: RE | Admit: 2022-05-28 | Discharge: 2022-05-28 | Disposition: A | Discharge status: Home / Self Care | Payer: No Typology Code available for payment source | Source: Ambulatory Visit | Attending: Neurosurgery | Admitting: Neurosurgery

## 2022-05-28 ENCOUNTER — Other Ambulatory Visit: Payer: Self-pay

## 2022-05-28 DIAGNOSIS — R296 Repeated falls: Secondary | ICD-10-CM | POA: Insufficient documentation

## 2022-05-28 DIAGNOSIS — R2681 Unsteadiness on feet: Secondary | ICD-10-CM | POA: Insufficient documentation

## 2022-05-28 DIAGNOSIS — R42 Dizziness and giddiness: Secondary | ICD-10-CM | POA: Insufficient documentation

## 2022-05-28 DIAGNOSIS — G912 (Idiopathic) normal pressure hydrocephalus: Secondary | ICD-10-CM | POA: Insufficient documentation

## 2022-05-28 DIAGNOSIS — R262 Difficulty in walking, not elsewhere classified: Secondary | ICD-10-CM | POA: Diagnosis not present

## 2022-05-28 DIAGNOSIS — M25562 Pain in left knee: Secondary | ICD-10-CM | POA: Insufficient documentation

## 2022-05-28 LAB — GLUCOSE, CAPILLARY: Glucose-Capillary: 214 mg/dL — ABNORMAL HIGH (ref 70–99)

## 2022-05-28 MED ORDER — LIDOCAINE HCL (PF) 1 % IJ SOLN
5.0000 mL | Freq: Once | INTRAMUSCULAR | Status: AC
  Administered 2022-05-28: 5 mL via INTRADERMAL

## 2022-05-28 NOTE — Discharge Instructions (Signed)
Myelogram and Lumbar Puncture Discharge Instructions  Go home and rest quietly for the next 24 hours.  It is important to lie flat for the next 24 hours.  Get up only to go to the restroom.  You may lie in the bed or on a couch on your back, your stomach, your left side or your right side.  You may have one pillow under your head.  You may have pillows between your knees while you are on your side or under your knees while you are on your back.  DO NOT drive today.  Recline the seat as far back as it will go, while still wearing your seat belt, on the way home.  You may get up to go to the bathroom as needed.  You may sit up for 10 minutes to eat.  You may resume your normal diet and medications unless otherwise indicated.  The incidence of headache, nausea, or vomiting is about 5% (one in 20 patients).  If you develop a headache, lie flat and drink plenty of fluids until the headache goes away.  Caffeinated beverages may be helpful.  If you develop severe nausea and vomiting or a headache that does not go away with flat bed rest, call [phone].  You may resume normal activities after your 24 hours of bed rest is over; however, do not exert yourself strongly or do any heavy lifting tomorrow.  Call your physician for a follow-up appointment.  The results of your myelogram will be sent directly to your physician by the following day.  Discharge instructions have been explained to the patient.  The patient, or the person responsible for the patient, fully understands these instructions.   

## 2022-05-29 ENCOUNTER — Ambulatory Visit (HOSPITAL_COMMUNITY): Payer: Non-veteran care

## 2022-05-29 ENCOUNTER — Ambulatory Visit (HOSPITAL_COMMUNITY): Payer: Medicare Other

## 2022-06-18 ENCOUNTER — Other Ambulatory Visit: Payer: Self-pay | Admitting: Neurosurgery

## 2022-06-19 ENCOUNTER — Other Ambulatory Visit: Payer: Self-pay | Admitting: Neurosurgery

## 2022-06-28 NOTE — Progress Notes (Signed)
Anesthesia Chart Review:S Same day workup  History of CAD s/p three-vessel CABG 8546, complicated DES x 2 to ramus intermedius in 2012, on chronic DAPT with Plavix and aspirin.  He is followed at the [facility] for this.  He was last seen by his PCP 06/15/2022 who discussed cardiac history as well as upcoming shunt placement at that time.  Per note, "He states from a cardiac standpoint, he has been doing well. He denies any chest pain, chest tightness, chest discomfort. Denies palpitations, near-syncope, or syncope. He denies orthopnea, paroxysmal nocturnal dyspnea. He  continues to complain of ongoing, stable shortness of breath. He has bilateral knee pain which limits his activities, states he has trouble exercising or increasing exercise tolerance due to his knee pain. Primary complaint today is dizziness and falls. We updated echocardiogram and Holter monitoring which were generally benign. 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 He had a recent spinal tap with removal of CSF, he states he noted several days of improved balance and decreased falls. He has had occasional issues with sternal pain following his surgery. He was referred to [facility] and underwent sternal wire removal 06/2019. Per his report he was told he may require a plate at some point. On a previous visit he had noticed increasing sternal incisional pain. With updated imaging and referred him back to [facility] to discuss options. They have decided not to pursue further surgical intervention. EKG personally reviewed today shows normal sinus rhythm rate of 85 BPM..Marland KitchenDizziness, falls: Cardiac work-up was basically benign, normal findings on home monitoring and echocardiogram. This appears to be secondary to hydrocephalus, he is scheduled for shunt placement."  Patient reports last dose Plavix 06/24/2022.  Non-insulin-dependent DM2, last A1c 7.4 on 06/11/2022.  BMP from [place] 06/11/2022  reviewed, creatinine elevated 1.74, otherwise unremarkable.  Patient will need day of surgery labs and evaluation.  TTE 12/21/2021 (Care Everywhere): The left ventricle is normal in size. There is mild concentric left ventricular  hypertrophy.Left ventricular systolic function is normal. EF= 62% by 2D biplane  method. No regional wall motion abnormalities noted. The right ventricle is normal in size and function. Borderline left atrial enlargement. Right atrial size is normal. The aortic valve is trileaflet. The aortic valve opens well. No aortic regurgitation is present. The mitral valve is grossly normal. There is trace mitral regurgitation. There is mild tricuspid regurgitation. Right ventricular systolic pressure is  estimated at 20 mmHg. The aortic root is normal size. There is no pericardial effusion.  Holter/Zio patch monitoring 01/11/2022 (Care Everywhere): Arrhythmia episodes: Rare isolated PVCs and 1 short run of nonsustained ventricular tachycardia  consisting of 5 beats occasional PACs and couplets and triplets of PACs including 22 short runs of  supraventricular tachycardia, longest SVT lasted for 20 beats with an average  rate of 127 bpm. Rare supraventricular ectopic beats correlated with a triggered event Patient triggered events: 6 and 2 diary entry Impression: Predominant underlying rhythm was sinus rhythm at a rate varying from 44  beats per minute minute to 105 bpm Rare isolated PVCs and 1 short run of nonsustained ventricular tachycardia  consisting of 5 beats Occasional PACs and couplets and triplets of PACs including 22 short runs of  supraventricular tachycardia, longest SVT lasted for 20 beats with an average  rate of 127 bpm. The VT and SVTs did not correlate with any triggered events Rare supraventricular ectopic beats correlated with a triggered event No atrial  fibrillation or atrial flutter detected. No significant pauses or AV block detected. Normal  heart rate variability throughout the day.  TTE 05/26/2020 (Care Everywhere): There is moderate concentric left ventricular hypertrophy. The left ventricle is normal in size and function Ejection Fraction = 87-56% Grade I diastolic dysfunction The right ventricle is normal in size and function. No significant valvular regurgitation or stenosis The IVC is normal in size with an inspiratory collapse of greater then 50%, suggesting normal right atrial pressure. There is no pericardial effusion.   Wynonia Musty [facility] Short Stay Center/Anesthesiology Phone [phone] 06/29/2022 11:23 AM

## 2022-06-29 ENCOUNTER — Encounter (HOSPITAL_COMMUNITY): Payer: Self-pay | Admitting: Neurosurgery

## 2022-06-29 ENCOUNTER — Other Ambulatory Visit: Payer: Self-pay

## 2022-06-29 NOTE — Anesthesia Preprocedure Evaluation (Signed)
Anesthesia Evaluation  Patient identified by MRN, date of birth, ID band Patient awake    Reviewed: Allergy & Precautions, NPO status , Patient's Chart, lab work & pertinent test results, reviewed documented beta blocker date and time   History of Anesthesia Complications Negative for: history of anesthetic complications  Airway Mallampati: II  TM Distance: >3 FB Neck ROM: Full    Dental  (+) Edentulous Upper, Edentulous Lower, Dental Advisory Given   Pulmonary sleep apnea (no longer uses his CPAP) , COPD,  COPD inhaler, Current Smoker and Patient abstained from smoking., former smoker,    breath sounds clear to auscultation       Cardiovascular hypertension, Pt. on medications and Pt. on home beta blockers (-) angina+ CAD, + Past MI, + Cardiac Stents and + CABG   Rhythm:Regular Rate:Normal  12/2021 ECHO: EF 62%, noormal LVF, norml RVF, no significant valvular abnormalities   Neuro/Psych PSYCHIATRIC DISORDERS (PTSD) Anxiety Depression hydrocephalus CVA, No Residual Symptoms    GI/Hepatic GERD  Medicated and Controlled,(+)     substance abuse (no ETOH in 15 years)  ,   Endo/Other  diabetes (glu 188), Insulin Dependent, Oral Hypoglycemic AgentsMorbid obesity  Renal/GU Renal InsufficiencyRenal disease     Musculoskeletal  (+) Arthritis ,   Abdominal (+) + obese,   Peds  Hematology   Anesthesia Other Findings   Reproductive/Obstetrics                         Anesthesia Physical Anesthesia Plan  ASA: 3  Anesthesia Plan: General   Post-op Pain Management: Tylenol PO (pre-op)*   Induction: Intravenous  PONV Risk Score and Plan: 2 and Ondansetron and Dexamethasone  Airway Management Planned: Oral ETT  Additional Equipment: None  Intra-op Plan:   Post-operative Plan: Extubation in OR  Informed Consent: I have reviewed the patients History and Physical, chart, labs and discussed the  procedure including the risks, benefits and alternatives for the proposed anesthesia with the patient or authorized representative who has indicated his/her understanding and acceptance.       Plan Discussed with: CRNA and Surgeon  Anesthesia Plan Comments: (PAT note by Karoline Caldwell, PA-C:  History of CAD s/p three-vessel CABG 5852, complicated DES x 2 to ramus intermedius in 2012, on chronic DAPT with Plavix and aspirin.  He is followed at the [facility] for this.  He was last seen by his PCP 06/15/2022 who discussed cardiac history as well as upcoming shunt placement at that time.  Per note, "He states from a cardiac standpoint, he has been doing well. He denies any chest pain, chest tightness, chest discomfort. Denies palpitations, near-syncope, or syncope. He denies orthopnea, paroxysmal nocturnal dyspnea. He  continues to complain of ongoing, stable shortness of breath. He has bilateral knee pain which limits his activities, states he has trouble exercising or increasing exercise tolerance due to his knee pain. Primary complaint today is dizziness and falls. We updated echocardiogram and Holter monitoring which were generally benign. 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 He had a recent spinal tap with removal of CSF, he states he noted several days of improved balance and decreased falls. He has had occasional issues with sternal pain following his surgery. He was referred to [facility] and underwent sternal wire removal 06/2019. Per his report he was told he may require a plate at some point. On a previous visit he had noticed  increasing sternal incisional pain. With updated imaging and referred him back to [facility] to discuss options. They have decided not to pursue further surgical intervention. EKG personally reviewed today shows normal sinus rhythm rate of 85 BPM..Marland KitchenDizziness, falls: Cardiac work-up was basically benign, normal findings on  home monitoring and echocardiogram. This appears to be secondary to hydrocephalus, he is scheduled for shunt placement."  Patient reports last dose Plavix 06/24/2022.  Non-insulin-dependent DM2, last A1c 7.4 on 06/11/2022.  BMP from [place] 06/11/2022 reviewed, creatinine elevated 1.74, otherwise unremarkable.  Patient will need day of surgery labs and evaluation.  TTE 12/21/2021 (Care Everywhere): The left ventricle is normal in size. There is mild concentric left ventricular  hypertrophy.Left ventricular systolic function is normal. EF= 62% by 2D biplane  method. No regional wall motion abnormalities noted. The right ventricle is normal in size and function. Borderline left atrial enlargement. Right atrial size is normal. The aortic valve is trileaflet. The aortic valve opens well. No aortic regurgitation is present. The mitral valve is grossly normal. There is trace mitral regurgitation. There is mild tricuspid regurgitation. Right ventricular systolic pressure is  estimated at 20 mmHg. The aortic root is normal size. There is no pericardial effusion.  Holter/Zio patch monitoring 01/11/2022 (Care Everywhere): Arrhythmia episodes: Rare isolated PVCs and 1 short run of nonsustained ventricular tachycardia  consisting of 5 beats occasional PACs and couplets and triplets of PACs including 22 short runs of  supraventricular tachycardia, longest SVT lasted for 20 beats with an average  rate of 127 bpm. Rare supraventricular ectopic beats correlated with a triggered event Patient triggered events: 6 and 2 diary entry Impression: Predominant underlying rhythm was sinus rhythm at a rate varying from 44  beats per minute minute to 105 bpm Rare isolated PVCs and 1 short run of nonsustained ventricular tachycardia  consisting of 5 beats Occasional PACs and couplets and triplets of PACs including 22 short runs of  supraventricular tachycardia, longest SVT lasted for 20 beats with an average  rate of  127 bpm. The VT and SVTs did not correlate with any triggered events Rare supraventricular ectopic beats correlated with a triggered event No atrial fibrillation or atrial flutter detected. No significant pauses or AV block detected. Normal heart rate variability throughout the day.  TTE 05/26/2020 (Care Everywhere): There is moderate concentric left ventricular hypertrophy. The left ventricle is normal in size and function Ejection Fraction = 46-50% Grade I diastolic dysfunction The right ventricle is normal in size and function. No significant valvular regurgitation or stenosis The IVC is normal in size with an inspiratory collapse of greater then 50%, suggesting normal right atrial pressure. There is no pericardial effusion.  )      Anesthesia Quick Evaluation

## 2022-06-29 NOTE — Progress Notes (Signed)
[facility] [place] [facility] Dr Chapman Fitch  Chest x-ray - n/a EKG - DOS Stress Test - n/a ECHO - 12/21/21 CE Cardiac Cath - 2008  ICD Pacemaker/Loop - n/a  Sleep Study -  Yes CPAP - does not use CPAP  Do not take Metformin on the morning of surgery.  THE NIGHT BEFORE SURGERY, take 35 Units of Novolog 70/30 Insulin.      THE MORNING OF SURGERY, do not  take Novolog Insulin.    If your blood sugar is less than 70 mg/dL, you will need to treat for low blood sugar: Treat a low blood sugar (less than 70 mg/dL) with  cup of clear juice (cranberry or apple), 4 glucose tablets, OR glucose gel. Recheck blood sugar in 15 minutes after treatment (to make sure it is greater than 70 mg/dL). If your blood sugar is not greater than 70 mg/dL on recheck, call [phone] for further instructions.  Blood Thinner Instructions:  Last dose of plavix was on 06/24/22  Aspirin Instructions: Last dose of ASA was on 06/24/22.  OSA - does not use CPAP  Anesthesia review: Yes  STOP now taking any Aspirin (unless otherwise instructed by your surgeon), Aleve, Naproxen, Ibuprofen, Motrin, Advil, Goody's, BC's, all herbal medications, fish oil, and all vitamins.   Coronavirus Screening Do you have any of the following symptoms:  Cough yes/no: No Fever (>100.73F)  yes/no: No Runny nose yes/no: No Sore throat yes/no: No Difficulty breathing/shortness of breath  Yes  Have you traveled in the last 14 days and where? yes/no: No  Daughter Micheal Duncan was given pre-op instructions over the phone. The opportunity was given for questions.  No further questions asked. Daughter Micheal Duncan verbalized understanding of instructions given.

## 2022-07-01 ENCOUNTER — Encounter (HOSPITAL_COMMUNITY): Payer: Self-pay | Admitting: Neurosurgery

## 2022-07-02 ENCOUNTER — Encounter (HOSPITAL_COMMUNITY)
Admission: RE | Disposition: A | Discharge status: Home / Self Care | Payer: Self-pay | Source: Home / Self Care | Attending: Neurosurgery

## 2022-07-02 ENCOUNTER — Inpatient Hospital Stay (HOSPITAL_COMMUNITY): Payer: No Typology Code available for payment source | Admitting: Physician Assistant

## 2022-07-02 ENCOUNTER — Other Ambulatory Visit: Payer: Self-pay

## 2022-07-02 ENCOUNTER — Inpatient Hospital Stay (HOSPITAL_COMMUNITY)
Admission: RE | Admit: 2022-07-02 | Discharge: 2022-07-03 | DRG: 033 | Disposition: A | Discharge status: Home / Self Care | Payer: No Typology Code available for payment source | Attending: Neurosurgery | Admitting: Neurosurgery

## 2022-07-02 ENCOUNTER — Encounter (HOSPITAL_COMMUNITY): Payer: Self-pay | Admitting: Neurosurgery

## 2022-07-02 DIAGNOSIS — G4730 Sleep apnea, unspecified: Secondary | ICD-10-CM | POA: Diagnosis present

## 2022-07-02 DIAGNOSIS — Z951 Presence of aortocoronary bypass graft: Secondary | ICD-10-CM | POA: Diagnosis not present

## 2022-07-02 DIAGNOSIS — Z8673 Personal history of transient ischemic attack (TIA), and cerebral infarction without residual deficits: Secondary | ICD-10-CM | POA: Diagnosis not present

## 2022-07-02 DIAGNOSIS — Z885 Allergy status to narcotic agent status: Secondary | ICD-10-CM | POA: Diagnosis not present

## 2022-07-02 DIAGNOSIS — I10 Essential (primary) hypertension: Secondary | ICD-10-CM

## 2022-07-02 DIAGNOSIS — Z87891 Personal history of nicotine dependence: Secondary | ICD-10-CM | POA: Diagnosis not present

## 2022-07-02 DIAGNOSIS — G912 (Idiopathic) normal pressure hydrocephalus: Principal | ICD-10-CM | POA: Diagnosis present

## 2022-07-02 DIAGNOSIS — I252 Old myocardial infarction: Secondary | ICD-10-CM

## 2022-07-02 DIAGNOSIS — F17290 Nicotine dependence, other tobacco product, uncomplicated: Secondary | ICD-10-CM | POA: Diagnosis present

## 2022-07-02 DIAGNOSIS — E119 Type 2 diabetes mellitus without complications: Secondary | ICD-10-CM | POA: Diagnosis present

## 2022-07-02 DIAGNOSIS — K219 Gastro-esophageal reflux disease without esophagitis: Secondary | ICD-10-CM | POA: Diagnosis present

## 2022-07-02 DIAGNOSIS — Z7984 Long term (current) use of oral hypoglycemic drugs: Secondary | ICD-10-CM | POA: Diagnosis not present

## 2022-07-02 DIAGNOSIS — I2510 Atherosclerotic heart disease of native coronary artery without angina pectoris: Secondary | ICD-10-CM | POA: Diagnosis present

## 2022-07-02 DIAGNOSIS — Z794 Long term (current) use of insulin: Secondary | ICD-10-CM

## 2022-07-02 DIAGNOSIS — Z85828 Personal history of other malignant neoplasm of skin: Secondary | ICD-10-CM | POA: Diagnosis not present

## 2022-07-02 DIAGNOSIS — Z955 Presence of coronary angioplasty implant and graft: Secondary | ICD-10-CM | POA: Diagnosis not present

## 2022-07-02 DIAGNOSIS — Z833 Family history of diabetes mellitus: Secondary | ICD-10-CM | POA: Diagnosis not present

## 2022-07-02 DIAGNOSIS — F32A Depression, unspecified: Secondary | ICD-10-CM | POA: Diagnosis present

## 2022-07-02 DIAGNOSIS — Z7982 Long term (current) use of aspirin: Secondary | ICD-10-CM

## 2022-07-02 DIAGNOSIS — Z7902 Long term (current) use of antithrombotics/antiplatelets: Secondary | ICD-10-CM | POA: Diagnosis not present

## 2022-07-02 DIAGNOSIS — J449 Chronic obstructive pulmonary disease, unspecified: Secondary | ICD-10-CM | POA: Diagnosis present

## 2022-07-02 DIAGNOSIS — E7800 Pure hypercholesterolemia, unspecified: Secondary | ICD-10-CM | POA: Diagnosis present

## 2022-07-02 DIAGNOSIS — Z79899 Other long term (current) drug therapy: Secondary | ICD-10-CM | POA: Diagnosis not present

## 2022-07-02 DIAGNOSIS — Z888 Allergy status to other drugs, medicaments and biological substances status: Secondary | ICD-10-CM

## 2022-07-02 HISTORY — DX: Other complications of anesthesia, initial encounter: T88.59XA

## 2022-07-02 HISTORY — DX: Anemia, unspecified: D64.9

## 2022-07-02 HISTORY — DX: Gastro-esophageal reflux disease without esophagitis: K21.9

## 2022-07-02 HISTORY — DX: Sleep apnea, unspecified: G47.30

## 2022-07-02 HISTORY — PX: VENTRICULOPERITONEAL SHUNT: SHX204

## 2022-07-02 HISTORY — DX: Other amnesia: R41.3

## 2022-07-02 HISTORY — DX: Myoneural disorder, unspecified: G70.9

## 2022-07-02 HISTORY — DX: Depression, unspecified: F32.A

## 2022-07-02 HISTORY — PX: LAPAROSCOPIC REVISION VENTRICULAR-PERITONEAL (V-P) SHUNT: SHX5924

## 2022-07-02 HISTORY — DX: Other psychoactive substance abuse, uncomplicated: F19.10

## 2022-07-02 HISTORY — DX: Chronic obstructive pulmonary disease, unspecified: J44.9

## 2022-07-02 HISTORY — DX: Chronic kidney disease, unspecified: N18.9

## 2022-07-02 LAB — CBC
HCT: 38.9 % — ABNORMAL LOW (ref 39.0–52.0)
Hemoglobin: 13.5 g/dL (ref 13.0–17.0)
MCH: 30.9 pg (ref 26.0–34.0)
MCHC: 34.7 g/dL (ref 30.0–36.0)
MCV: 89 fL (ref 80.0–100.0)
Platelets: 174 10*3/uL (ref 150–400)
RBC: 4.37 MIL/uL (ref 4.22–5.81)
RDW: 13.3 % (ref 11.5–15.5)
WBC: 5.2 10*3/uL (ref 4.0–10.5)
nRBC: 0 % (ref 0.0–0.2)

## 2022-07-02 LAB — PROTEIN AND GLUCOSE, CSF
Glucose, CSF: 105 mg/dL — ABNORMAL HIGH (ref 40–70)
Total  Protein, CSF: 20 mg/dL (ref 15–45)

## 2022-07-02 LAB — GLUCOSE, CAPILLARY
Glucose-Capillary: 188 mg/dL — ABNORMAL HIGH (ref 70–99)
Glucose-Capillary: 121 mg/dL — ABNORMAL HIGH (ref 70–99)
Glucose-Capillary: 147 mg/dL — ABNORMAL HIGH (ref 70–99)
Glucose-Capillary: 131 mg/dL — ABNORMAL HIGH (ref 70–99)
Glucose-Capillary: 295 mg/dL — ABNORMAL HIGH (ref 70–99)
Glucose-Capillary: 222 mg/dL — ABNORMAL HIGH (ref 70–99)

## 2022-07-02 LAB — TYPE AND SCREEN
ABO/RH(D): B POS
Antibody Screen: NEGATIVE

## 2022-07-02 LAB — COMPREHENSIVE METABOLIC PANEL
ALT: 52 U/L — ABNORMAL HIGH (ref 0–44)
AST: 33 U/L (ref 15–41)
Albumin: 3.5 g/dL (ref 3.5–5.0)
Alkaline Phosphatase: 100 U/L (ref 38–126)
Anion gap: 10 (ref 5–15)
BUN: 15 mg/dL (ref 8–23)
CO2: 22 mmol/L (ref 22–32)
Calcium: 8.5 mg/dL — ABNORMAL LOW (ref 8.9–10.3)
Chloride: 108 mmol/L (ref 98–111)
Creatinine, Ser: 1.3 mg/dL — ABNORMAL HIGH (ref 0.61–1.24)
GFR, Estimated: 58 mL/min — ABNORMAL LOW (ref >60)
Glucose, Bld: 192 mg/dL — ABNORMAL HIGH (ref 70–99)
Potassium: 3.7 mmol/L (ref 3.5–5.1)
Sodium: 140 mmol/L (ref 135–145)
Total Bilirubin: 0.7 mg/dL (ref 0.3–1.2)
Total Protein: 6.1 g/dL — ABNORMAL LOW (ref 6.5–8.1)

## 2022-07-02 LAB — CSF CELL COUNT WITH DIFFERENTIAL
RBC Count, CSF: 34 /mm3 — ABNORMAL HIGH
WBC, CSF: 0 /mm3 (ref 0–5)

## 2022-07-02 LAB — HEMOGLOBIN A1C
Hgb A1c MFr Bld: 7 % — ABNORMAL HIGH (ref 4.8–5.6)
Mean Plasma Glucose: 154.2 mg/dL

## 2022-07-02 LAB — SURGICAL PCR SCREEN
MRSA, PCR: NEGATIVE
Staphylococcus aureus: NEGATIVE

## 2022-07-02 LAB — ABO/RH: ABO/RH(D): B POS

## 2022-07-02 SURGERY — SHUNT INSERTION VENTRICULAR-PERITONEAL
Anesthesia: General | Laterality: Right

## 2022-07-02 MED ORDER — PRAZOSIN HCL 2 MG PO CAPS
4.0000 mg | ORAL_CAPSULE | Freq: Every day | ORAL | Status: DC
  Administered 2022-07-02: 4 mg via ORAL
  Filled 2022-07-02 (×2): qty 2

## 2022-07-02 MED ORDER — METHOCARBAMOL 500 MG PO TABS
500.0000 mg | ORAL_TABLET | Freq: Three times a day (TID) | ORAL | Status: DC
  Administered 2022-07-02 – 2022-07-03 (×4): 500 mg via ORAL
  Filled 2022-07-02 (×4): qty 1

## 2022-07-02 MED ORDER — HYDROMORPHONE HCL 1 MG/ML IJ SOLN
0.2500 mg | INTRAMUSCULAR | Status: DC | PRN
  Administered 2022-07-02 (×2): 0.5 mg via INTRAVENOUS

## 2022-07-02 MED ORDER — ONDANSETRON HCL 4 MG/2ML IJ SOLN: 4.0000 mg | INTRAMUSCULAR | Status: DC | PRN

## 2022-07-02 MED ORDER — SUGAMMADEX SODIUM 200 MG/2ML IV SOLN
INTRAVENOUS | Status: DC | PRN
  Administered 2022-07-02: 100 mg via INTRAVENOUS
  Administered 2022-07-02: 225 mg via INTRAVENOUS

## 2022-07-02 MED ORDER — LORATADINE 10 MG PO TABS
10.0000 mg | ORAL_TABLET | Freq: Every day | ORAL | Status: DC
  Administered 2022-07-02 – 2022-07-03 (×2): 10 mg via ORAL
  Filled 2022-07-02 (×2): qty 1

## 2022-07-02 MED ORDER — RISAQUAD PO CAPS
1.0000 | ORAL_CAPSULE | Freq: Every morning | ORAL | Status: DC
  Administered 2022-07-02 – 2022-07-03 (×2): 1 via ORAL
  Filled 2022-07-02 (×2): qty 1

## 2022-07-02 MED ORDER — INSULIN ASPART 100 UNIT/ML IJ SOLN
INTRAMUSCULAR | Status: AC
  Filled 2022-07-02: qty 1

## 2022-07-02 MED ORDER — METOPROLOL SUCCINATE ER 25 MG PO TB24
25.0000 mg | ORAL_TABLET | Freq: Every day | ORAL | Status: DC
  Administered 2022-07-03: 25 mg via ORAL
  Filled 2022-07-02 (×2): qty 1

## 2022-07-02 MED ORDER — BUPIVACAINE HCL (PF) 0.5 % IJ SOLN
INTRAMUSCULAR | Status: AC
  Filled 2022-07-02: qty 30

## 2022-07-02 MED ORDER — LIDOCAINE 2% (20 MG/ML) 5 ML SYRINGE
INTRAMUSCULAR | Status: DC | PRN
  Administered 2022-07-02: 40 mg via INTRAVENOUS

## 2022-07-02 MED ORDER — OXYCODONE HCL 5 MG/5ML PO SOLN: 5.0000 mg | Freq: Once | ORAL | Status: DC | PRN

## 2022-07-02 MED ORDER — MONTELUKAST SODIUM 10 MG PO TABS
10.0000 mg | ORAL_TABLET | Freq: Every morning | ORAL | Status: DC
  Administered 2022-07-02 – 2022-07-03 (×2): 10 mg via ORAL
  Filled 2022-07-02 (×2): qty 1

## 2022-07-02 MED ORDER — BACITRACIN ZINC 500 UNIT/GM EX OINT
TOPICAL_OINTMENT | CUTANEOUS | Status: AC
Start: 2022-07-02 — End: ?
  Filled 2022-07-02: qty 28.35

## 2022-07-02 MED ORDER — LISINOPRIL 5 MG PO TABS
5.0000 mg | ORAL_TABLET | Freq: Every day | ORAL | Status: DC
  Administered 2022-07-02 – 2022-07-03 (×2): 5 mg via ORAL
  Filled 2022-07-02 (×2): qty 1

## 2022-07-02 MED ORDER — METOPROLOL SUCCINATE ER 25 MG PO TB24
ORAL_TABLET | ORAL | Status: AC
  Administered 2022-07-02: 25 mg
  Filled 2022-07-02: qty 1

## 2022-07-02 MED ORDER — DEXAMETHASONE SODIUM PHOSPHATE 10 MG/ML IJ SOLN
INTRAMUSCULAR | Status: DC | PRN
  Administered 2022-07-02: 4 mg via INTRAVENOUS

## 2022-07-02 MED ORDER — MORPHINE SULFATE (PF) 2 MG/ML IV SOLN: 1.0000 mg | INTRAVENOUS | Status: DC | PRN

## 2022-07-02 MED ORDER — FLUOXETINE HCL 20 MG PO CAPS
20.0000 mg | ORAL_CAPSULE | Freq: Every day | ORAL | Status: DC
  Administered 2022-07-02 – 2022-07-03 (×2): 20 mg via ORAL
  Filled 2022-07-02 (×3): qty 1

## 2022-07-02 MED ORDER — LACTATED RINGERS IV SOLN: INTRAVENOUS | Status: DC

## 2022-07-02 MED ORDER — PROMETHAZINE HCL 25 MG/ML IJ SOLN: 6.2500 mg | INTRAMUSCULAR | Status: DC | PRN

## 2022-07-02 MED ORDER — ACETAMINOPHEN 325 MG PO TABS: 650.0000 mg | ORAL_TABLET | ORAL | Status: DC | PRN

## 2022-07-02 MED ORDER — POLYVINYL ALCOHOL 1.4 % OP SOLN: 1.0000 [drp] | Freq: Four times a day (QID) | OPHTHALMIC | Status: DC | PRN

## 2022-07-02 MED ORDER — BUPROPION HCL 75 MG PO TABS
75.0000 mg | ORAL_TABLET | Freq: Every day | ORAL | Status: DC
  Administered 2022-07-02 – 2022-07-03 (×2): 75 mg via ORAL
  Filled 2022-07-02 (×2): qty 1

## 2022-07-02 MED ORDER — CEFAZOLIN SODIUM-DEXTROSE 2-4 GM/100ML-% IV SOLN
2.0000 g | Freq: Three times a day (TID) | INTRAVENOUS | Status: AC
  Administered 2022-07-02 – 2022-07-03 (×2): 2 g via INTRAVENOUS
  Filled 2022-07-02 (×2): qty 100

## 2022-07-02 MED ORDER — DOCUSATE SODIUM 100 MG PO CAPS
100.0000 mg | ORAL_CAPSULE | Freq: Every day | ORAL | Status: DC
Start: 2022-07-02 — End: 2022-07-03
  Administered 2022-07-02 – 2022-07-03 (×2): 100 mg via ORAL
  Filled 2022-07-02 (×2): qty 1

## 2022-07-02 MED ORDER — SUCCINYLCHOLINE CHLORIDE 200 MG/10ML IV SOSY
PREFILLED_SYRINGE | INTRAVENOUS | Status: DC | PRN
  Administered 2022-07-02: 140 mg via INTRAVENOUS

## 2022-07-02 MED ORDER — ONDANSETRON HCL 4 MG PO TABS: 4.0000 mg | ORAL_TABLET | ORAL | Status: DC | PRN

## 2022-07-02 MED ORDER — ACETAMINOPHEN 650 MG RE SUPP: 650.0000 mg | RECTAL | Status: DC | PRN

## 2022-07-02 MED ORDER — SUCCINYLCHOLINE CHLORIDE 200 MG/10ML IV SOSY
PREFILLED_SYRINGE | INTRAVENOUS | Status: AC
  Filled 2022-07-02: qty 10

## 2022-07-02 MED ORDER — SUGAMMADEX SODIUM 500 MG/5ML IV SOLN
INTRAVENOUS | Status: AC
  Filled 2022-07-02: qty 10

## 2022-07-02 MED ORDER — LIDOCAINE 2% (20 MG/ML) 5 ML SYRINGE
INTRAMUSCULAR | Status: AC
  Filled 2022-07-02: qty 5

## 2022-07-02 MED ORDER — FENTANYL CITRATE (PF) 250 MCG/5ML IJ SOLN
INTRAMUSCULAR | Status: AC
  Filled 2022-07-02: qty 5

## 2022-07-02 MED ORDER — DEXAMETHASONE SODIUM PHOSPHATE 10 MG/ML IJ SOLN
INTRAMUSCULAR | Status: AC
  Filled 2022-07-02: qty 1

## 2022-07-02 MED ORDER — LABETALOL HCL 5 MG/ML IV SOLN: 10.0000 mg | INTRAVENOUS | Status: DC | PRN

## 2022-07-02 MED ORDER — HYDROMORPHONE HCL 1 MG/ML IJ SOLN
INTRAMUSCULAR | Status: AC
  Filled 2022-07-02: qty 1

## 2022-07-02 MED ORDER — THROMBIN 5000 UNITS EX SOLR
CUTANEOUS | Status: AC
  Filled 2022-07-02: qty 5000

## 2022-07-02 MED ORDER — FENTANYL CITRATE (PF) 250 MCG/5ML IJ SOLN
INTRAMUSCULAR | Status: DC | PRN
  Administered 2022-07-02: 100 ug via INTRAVENOUS
  Administered 2022-07-02 (×3): 50 ug via INTRAVENOUS

## 2022-07-02 MED ORDER — INSULIN ASPART 100 UNIT/ML IJ SOLN: 0.0000 [IU] | Freq: Three times a day (TID) | INTRAMUSCULAR | Status: DC

## 2022-07-02 MED ORDER — LIDOCAINE-EPINEPHRINE 1 %-1:100000 IJ SOLN
INTRAMUSCULAR | Status: DC | PRN
  Administered 2022-07-02: 2 mL

## 2022-07-02 MED ORDER — ASPIRIN 81 MG PO CHEW
81.0000 mg | CHEWABLE_TABLET | Freq: Every day | ORAL | Status: DC
Start: 2022-07-02 — End: 2022-07-03
  Administered 2022-07-02 – 2022-07-03 (×2): 81 mg via ORAL
  Filled 2022-07-02 (×2): qty 1

## 2022-07-02 MED ORDER — SEMAGLUTIDE (1 MG/DOSE) 4 MG/3ML ~~LOC~~ SOPN: 1.0000 mg | PEN_INJECTOR | SUBCUTANEOUS | Status: DC

## 2022-07-02 MED ORDER — BUPIVACAINE HCL (PF) 0.5 % IJ SOLN
INTRAMUSCULAR | Status: DC | PRN
  Administered 2022-07-02: 2 mL

## 2022-07-02 MED ORDER — PROPOFOL 10 MG/ML IV BOLUS
INTRAVENOUS | Status: DC | PRN
  Administered 2022-07-02: 100 mg via INTRAVENOUS

## 2022-07-02 MED ORDER — LIDOCAINE-EPINEPHRINE 1 %-1:100000 IJ SOLN
INTRAMUSCULAR | Status: AC
  Filled 2022-07-02: qty 1

## 2022-07-02 MED ORDER — PROPOFOL 10 MG/ML IV BOLUS
INTRAVENOUS | Status: AC
  Filled 2022-07-02: qty 20

## 2022-07-02 MED ORDER — MIDAZOLAM HCL 2 MG/2ML IJ SOLN: 0.5000 mg | Freq: Once | INTRAMUSCULAR | Status: DC | PRN

## 2022-07-02 MED ORDER — ROCURONIUM BROMIDE 10 MG/ML (PF) SYRINGE
PREFILLED_SYRINGE | INTRAVENOUS | Status: AC
  Filled 2022-07-02: qty 10

## 2022-07-02 MED ORDER — ATORVASTATIN CALCIUM 80 MG PO TABS
80.0000 mg | ORAL_TABLET | Freq: Every day | ORAL | Status: DC
  Administered 2022-07-02: 80 mg via ORAL
  Filled 2022-07-02: qty 1

## 2022-07-02 MED ORDER — LACTATED RINGERS IV SOLN: INTRAVENOUS | Status: DC | PRN

## 2022-07-02 MED ORDER — CHLORHEXIDINE GLUCONATE 0.12 % MT SOLN
15.0000 mL | Freq: Once | OROMUCOSAL | Status: AC
  Administered 2022-07-02: 15 mL via OROMUCOSAL
  Filled 2022-07-02: qty 15

## 2022-07-02 MED ORDER — ROCURONIUM BROMIDE 10 MG/ML (PF) SYRINGE
PREFILLED_SYRINGE | INTRAVENOUS | Status: DC | PRN
  Administered 2022-07-02: 40 mg via INTRAVENOUS
  Administered 2022-07-02: 20 mg via INTRAVENOUS

## 2022-07-02 MED ORDER — CYANOCOBALAMIN 500 MCG PO TABS
1000.0000 ug | ORAL_TABLET | Freq: Every day | ORAL | Status: DC
  Administered 2022-07-02 – 2022-07-03 (×2): 1000 ug via ORAL
  Filled 2022-07-02 (×2): qty 2

## 2022-07-02 MED ORDER — 0.9 % SODIUM CHLORIDE (POUR BTL) OPTIME
TOPICAL | Status: DC | PRN
  Administered 2022-07-02: 1000 mL

## 2022-07-02 MED ORDER — SODIUM CHLORIDE 0.9 % IV SOLN: INTRAVENOUS | Status: DC

## 2022-07-02 MED ORDER — ORAL CARE MOUTH RINSE: 15.0000 mL | Freq: Once | OROMUCOSAL | Status: AC

## 2022-07-02 MED ORDER — PANTOPRAZOLE SODIUM 40 MG PO TBEC
40.0000 mg | DELAYED_RELEASE_TABLET | Freq: Every day | ORAL | Status: DC
  Administered 2022-07-02 – 2022-07-03 (×2): 40 mg via ORAL
  Filled 2022-07-02 (×2): qty 1

## 2022-07-02 MED ORDER — CEFAZOLIN SODIUM-DEXTROSE 2-4 GM/100ML-% IV SOLN
2.0000 g | INTRAVENOUS | Status: AC
  Administered 2022-07-02: 2 g via INTRAVENOUS
  Filled 2022-07-02: qty 100

## 2022-07-02 MED ORDER — NITROGLYCERIN 0.4 MG SL SUBL: 0.4000 mg | SUBLINGUAL_TABLET | SUBLINGUAL | Status: DC | PRN

## 2022-07-02 MED ORDER — CHLORHEXIDINE GLUCONATE CLOTH 2 % EX PADS: 6.0000 | MEDICATED_PAD | Freq: Once | CUTANEOUS | Status: DC

## 2022-07-02 MED ORDER — BUPIVACAINE-EPINEPHRINE 0.25% -1:200000 IJ SOLN
INTRAMUSCULAR | Status: DC | PRN
  Administered 2022-07-02: 20 mL

## 2022-07-02 MED ORDER — FLUTICASONE PROPIONATE 50 MCG/ACT NA SUSP
2.0000 | Freq: Every day | NASAL | Status: DC | PRN
Start: 2022-07-02 — End: 2022-07-03

## 2022-07-02 MED ORDER — ACETAMINOPHEN 500 MG PO TABS
1000.0000 mg | ORAL_TABLET | Freq: Once | ORAL | Status: AC
  Administered 2022-07-02: 1000 mg via ORAL
  Filled 2022-07-02: qty 2

## 2022-07-02 MED ORDER — PHENYLEPHRINE 80 MCG/ML (10ML) SYRINGE FOR IV PUSH (FOR BLOOD PRESSURE SUPPORT)
PREFILLED_SYRINGE | INTRAVENOUS | Status: DC | PRN
  Administered 2022-07-02 (×4): 80 ug via INTRAVENOUS

## 2022-07-02 MED ORDER — THIAMINE HCL 100 MG PO TABS
100.0000 mg | ORAL_TABLET | Freq: Every day | ORAL | Status: DC
  Administered 2022-07-02 – 2022-07-03 (×2): 100 mg via ORAL
  Filled 2022-07-02 (×2): qty 1

## 2022-07-02 MED ORDER — BACITRACIN ZINC 500 UNIT/GM EX OINT
TOPICAL_OINTMENT | CUTANEOUS | Status: DC | PRN
  Administered 2022-07-02: 1 via TOPICAL

## 2022-07-02 MED ORDER — ALBUTEROL SULFATE (2.5 MG/3ML) 0.083% IN NEBU
2.5000 mg | INHALATION_SOLUTION | Freq: Four times a day (QID) | RESPIRATORY_TRACT | Status: DC | PRN
Start: 2022-07-02 — End: 2022-07-03

## 2022-07-02 MED ORDER — INSULIN ASPART PROT & ASPART (70-30 MIX) 100 UNIT/ML ~~LOC~~ SUSP
50.0000 [IU] | Freq: Two times a day (BID) | SUBCUTANEOUS | Status: DC
  Administered 2022-07-02 – 2022-07-03 (×2): 50 [IU] via SUBCUTANEOUS
  Filled 2022-07-02: qty 10

## 2022-07-02 MED ORDER — PROMETHAZINE HCL 25 MG PO TABS: 12.5000 mg | ORAL_TABLET | ORAL | Status: DC | PRN

## 2022-07-02 MED ORDER — ONDANSETRON HCL 4 MG/2ML IJ SOLN
INTRAMUSCULAR | Status: AC
Start: 2022-07-02 — End: ?
  Filled 2022-07-02: qty 2

## 2022-07-02 MED ORDER — BUPIVACAINE-EPINEPHRINE (PF) 0.25% -1:200000 IJ SOLN
INTRAMUSCULAR | Status: AC
  Filled 2022-07-02: qty 30

## 2022-07-02 MED ORDER — METFORMIN HCL ER 500 MG PO TB24
500.0000 mg | ORAL_TABLET | Freq: Two times a day (BID) | ORAL | Status: DC
  Administered 2022-07-02 – 2022-07-03 (×3): 500 mg via ORAL
  Filled 2022-07-02 (×4): qty 1

## 2022-07-02 MED ORDER — OXYCODONE HCL 5 MG PO TABS: 5.0000 mg | ORAL_TABLET | Freq: Once | ORAL | Status: DC | PRN

## 2022-07-02 MED ORDER — MIDAZOLAM HCL 2 MG/2ML IJ SOLN
INTRAMUSCULAR | Status: AC
  Filled 2022-07-02: qty 2

## 2022-07-02 MED ORDER — ISOSORBIDE MONONITRATE ER 30 MG PO TB24
30.0000 mg | ORAL_TABLET | Freq: Every day | ORAL | Status: DC
Start: 2022-07-02 — End: 2022-07-03
  Administered 2022-07-02 – 2022-07-03 (×2): 30 mg via ORAL
  Filled 2022-07-02 (×2): qty 1

## 2022-07-02 MED ORDER — FUROSEMIDE 40 MG PO TABS
40.0000 mg | ORAL_TABLET | Freq: Every day | ORAL | Status: DC | PRN
Start: 2022-07-02 — End: 2022-07-03

## 2022-07-02 MED ORDER — HYDROCODONE-ACETAMINOPHEN 5-325 MG PO TABS
1.0000 | ORAL_TABLET | ORAL | Status: DC | PRN
  Administered 2022-07-02 – 2022-07-03 (×4): 1 via ORAL
  Filled 2022-07-02 (×4): qty 1

## 2022-07-02 MED ORDER — OXYBUTYNIN CHLORIDE 5 MG PO TABS
5.0000 mg | ORAL_TABLET | Freq: Every morning | ORAL | Status: DC
  Administered 2022-07-02 – 2022-07-03 (×2): 5 mg via ORAL
  Filled 2022-07-02 (×2): qty 1

## 2022-07-02 MED ORDER — THROMBIN 5000 UNITS EX SOLR
OROMUCOSAL | Status: DC | PRN
  Administered 2022-07-02: 5 mL via TOPICAL

## 2022-07-02 MED ORDER — ALBUTEROL SULFATE HFA 108 (90 BASE) MCG/ACT IN AERS: 2.0000 | INHALATION_SPRAY | Freq: Four times a day (QID) | RESPIRATORY_TRACT | Status: DC | PRN

## 2022-07-02 MED ORDER — ONDANSETRON HCL 4 MG/2ML IJ SOLN
INTRAMUSCULAR | Status: DC | PRN
  Administered 2022-07-02: 4 mg via INTRAVENOUS

## 2022-07-02 MED ORDER — MEPERIDINE HCL 25 MG/ML IJ SOLN: 6.2500 mg | INTRAMUSCULAR | Status: DC | PRN

## 2022-07-02 SURGICAL SUPPLY — 85 items
BAG COUNTER SPONGE SURGICOUNT (BAG) ×6 IMPLANT
BLADE CLIPPER SURG (BLADE) ×6 IMPLANT
BLADE SURG 11 STRL SS (BLADE) ×6 IMPLANT
BOOT SUTURE AID YELLOW STND (SUTURE) IMPLANT
BUR PRECISION FLUTE 5.0 (BURR) ×3 IMPLANT
CANISTER SUCT 3000ML PPV (MISCELLANEOUS) ×3 IMPLANT
CARTRIDGE OIL MAESTRO DRILL (MISCELLANEOUS) ×2 IMPLANT
CATH VENTRICULAR 7CM (Shunt) ×1 IMPLANT
CHLORAPREP W/TINT 26 (MISCELLANEOUS) ×3 IMPLANT
CLIP RANEY DISP (INSTRUMENTS) IMPLANT
DERMABOND ADVANCED (GAUZE/BANDAGES/DRESSINGS) ×1
DERMABOND ADVANCED .7 DNX12 (GAUZE/BANDAGES/DRESSINGS) ×2 IMPLANT
DIFFUSER DRILL AIR PNEUMATIC (MISCELLANEOUS) ×3 IMPLANT
DRAPE HALF SHEET 40X57 (DRAPES) ×3 IMPLANT
DRAPE INCISE IOBAN 66X45 STRL (DRAPES) ×3 IMPLANT
DRAPE ORTHO SPLIT 77X108 STRL (DRAPES) ×6
DRAPE SURG ORHT 6 SPLT 77X108 (DRAPES) ×4 IMPLANT
DRSG OPSITE POSTOP 3X4 (GAUZE/BANDAGES/DRESSINGS) ×2 IMPLANT
DRSG TEGADERM 2-3/8X2-3/4 SM (GAUZE/BANDAGES/DRESSINGS) ×1 IMPLANT
DURAPREP 26ML APPLICATOR (WOUND CARE) ×6 IMPLANT
ELECT REM PT RETURN 9FT ADLT (ELECTROSURGICAL) ×3
ELECTRODE REM PT RTRN 9FT ADLT (ELECTROSURGICAL) ×2 IMPLANT
GAUZE 4X4 16PLY ~~LOC~~+RFID DBL (SPONGE) IMPLANT
GAUZE SPONGE 2X2 8PLY STRL LF (GAUZE/BANDAGES/DRESSINGS) IMPLANT
GLOVE BIO SURGEON STRL SZ7.5 (GLOVE) IMPLANT
GLOVE BIOGEL PI IND STRL 7.0 (GLOVE) ×2 IMPLANT
GLOVE BIOGEL PI IND STRL 7.5 (GLOVE) ×4 IMPLANT
GLOVE BIOGEL PI INDICATOR 7.0 (GLOVE) ×1
GLOVE BIOGEL PI INDICATOR 7.5 (GLOVE) ×2
GLOVE ECLIPSE 7.0 STRL STRAW (GLOVE) ×6 IMPLANT
GLOVE EXAM NITRILE XL STR (GLOVE) IMPLANT
GLOVE SURG SS PI 7.0 STRL IVOR (GLOVE) ×3 IMPLANT
GOWN STRL REUS W/ TWL LRG LVL3 (GOWN DISPOSABLE) ×6 IMPLANT
GOWN STRL REUS W/ TWL XL LVL3 (GOWN DISPOSABLE) IMPLANT
GOWN STRL REUS W/TWL 2XL LVL3 (GOWN DISPOSABLE) IMPLANT
GOWN STRL REUS W/TWL LRG LVL3 (GOWN DISPOSABLE) ×9
GOWN STRL REUS W/TWL XL LVL3 (GOWN DISPOSABLE)
HEMOSTAT POWDER KIT SURGIFOAM (HEMOSTASIS) ×1 IMPLANT
HEMOSTAT SURGICEL 2X14 (HEMOSTASIS) IMPLANT
KIT BASIN OR (CUSTOM PROCEDURE TRAY) ×3 IMPLANT
KIT TURNOVER KIT B (KITS) ×3 IMPLANT
MARKER SKIN DUAL TIP RULER LAB (MISCELLANEOUS) ×6 IMPLANT
NDL HYPO 25GX1X1/2 BEV (NEEDLE) ×2 IMPLANT
NDL HYPO 25X1 1.5 SAFETY (NEEDLE) ×2 IMPLANT
NEEDLE 22X1 1/2 (OR ONLY) (NEEDLE) ×3 IMPLANT
NEEDLE HYPO 25GX1X1/2 BEV (NEEDLE) ×3 IMPLANT
NEEDLE HYPO 25X1 1.5 SAFETY (NEEDLE) ×3 IMPLANT
NS IRRIG 1000ML POUR BTL (IV SOLUTION) ×3 IMPLANT
OIL CARTRIDGE MAESTRO DRILL (MISCELLANEOUS) ×3
PACK LAMINECTOMY NEURO (CUSTOM PROCEDURE TRAY) ×3 IMPLANT
PAD ARMBOARD 7.5X6 YLW CONV (MISCELLANEOUS) ×9 IMPLANT
PASSER CATH 65CM DISP (NEUROSURGERY SUPPLIES) ×2 IMPLANT
PASSER CATH SHUNT 55CM (INSTRUMENTS) ×1 IMPLANT
SET COLLECT BLD 25X3/4 12 (NEEDLE) ×1 IMPLANT
SET IRRIG TUBING LAPAROSCOPIC (IRRIGATION / IRRIGATOR) ×1 IMPLANT
SET TUBE SMOKE EVAC HIGH FLOW (TUBING) ×1 IMPLANT
SHEATH COOK PEEL AWAY SET 9F (SHEATH) ×3 IMPLANT
SHEATH PERITONEAL INTRO 61 (SHEATH) ×3 IMPLANT
SHUNT STRATA 11 SNAP REG (Shunt) ×1 IMPLANT
SLEEVE ENDOPATH XCEL 5M (ENDOMECHANICALS) ×2 IMPLANT
SPIKE FLUID TRANSFER (MISCELLANEOUS) ×4 IMPLANT
SPONGE GAUZE 2X2 STER 10/PKG (GAUZE/BANDAGES/DRESSINGS) ×1
SPONGE SURGIFOAM ABS GEL SZ50 (HEMOSTASIS) ×2 IMPLANT
SPONGE T-LAP 4X18 ~~LOC~~+RFID (SPONGE) IMPLANT
STAPLER SKIN PROX WIDE 3.9 (STAPLE) ×3 IMPLANT
STAPLER VISISTAT 35W (STAPLE) ×1 IMPLANT
STRIP CLOSURE SKIN 1/2X4 (GAUZE/BANDAGES/DRESSINGS) IMPLANT
SUT ETHILON 3 0 PS 1 (SUTURE) ×1 IMPLANT
SUT MNCRL AB 4-0 PS2 18 (SUTURE) ×3 IMPLANT
SUT NURALON 4 0 TR CR/8 (SUTURE) IMPLANT
SUT SILK 0 TIES 10X30 (SUTURE) ×3 IMPLANT
SUT SILK 3 0 SH 30 (SUTURE) IMPLANT
SUT VIC AB 3-0 SH 27 (SUTURE) ×3
SUT VIC AB 3-0 SH 27X BRD (SUTURE) ×2 IMPLANT
SUT VIC AB 3-0 SH 8-18 (SUTURE) ×6 IMPLANT
SUT VICRYL 0 UR6 27IN ABS (SUTURE) ×2 IMPLANT
SYR CONTROL 10ML LL (SYRINGE) ×4 IMPLANT
TOWEL GREEN STERILE (TOWEL DISPOSABLE) ×6 IMPLANT
TOWEL GREEN STERILE FF (TOWEL DISPOSABLE) ×3 IMPLANT
TRAY LAPAROSCOPIC MC (CUSTOM PROCEDURE TRAY) ×3 IMPLANT
TROCAR XCEL NON-BLD 5MMX100MML (ENDOMECHANICALS) ×3 IMPLANT
TROCAR Z-THREAD OPTICAL 5X100M (TROCAR) ×1 IMPLANT
TUBE CONNECTING 12X1/4 (SUCTIONS) ×3 IMPLANT
UNDERPAD 30X36 HEAVY ABSORB (UNDERPADS AND DIAPERS) ×3 IMPLANT
WATER STERILE IRR 1000ML POUR (IV SOLUTION) ×4 IMPLANT

## 2022-07-02 NOTE — Op Note (Signed)
  NEUROSURGERY OPERATIVE NOTE   PREOP DIAGNOSIS: Normal pressure hydrocephalus  POSTOP DIAGNOSIS: Same  PROCEDURE: 1. Laparoscopic Assisted right ventriculoperitoneal shunt placement  SURGEON: Dr. Consuella Lose, MD  CO-SURGEON: Dr. Gurney Maxin, MD  ANESTHESIA: General Endotracheal  EBL: Minimal  SPECIMENS:  CSF for glucose, protein, cell count with differential, CSF VDRL  DRAINS: None  COMPLICATIONS: None immediate  CONDITION: Stable to ICU  SHUNT PLACED: Med[employer]onic Strata II, set to 1.5  HISTORY: Micheal Duncan is a 76 y.o. male initially seen in the outpatient neurosurgery clinic with gait instability, urinary incontinence, and progressive cognitive decline with imaging demonstrating ventriculomegaly suggestive of normal pressure hydrocephalus.  Patient underwent high-volume lumbar puncture with temporary but noticeable improvement in his symptoms.  He therefore elected to undergo placement of a ventriculoperitoneal shunt.  Patient is also seen his infectious disease doctors where possible diagnosis of neurosyphilis was entertained although pretest probability is relatively low.  The risks, benefits, and alternatives to the surgery were all reviewed in detail with the patient and his daughter.  After all questions were answered informed consent was obtained and witnessed.  PROCEDURE IN DETAIL: The patient was brought to the operating room and transferred to the operative table. After induction of general anesthesia, the patient was positioned on the operative table in the supine position with all pressure points meticulously padded. The skin of the scalp,  neck, chest, and abdomen were prepped in the usual sterile fashion.  Curvilinear right frontal scalp incision was infiltrated with local anesthetic and opened sharply.  A tunnel was then created using a hemostat subcutaneously to the retroauricular region.  Counter incision was made behind the ear, and the distal shunt  apparatus was then passed and the valve was seated subcutaneously.  The shunt passer was then used to tunnel from the retroauricular incision into the right upper quadrant.   Using a high-speed drill, bur hole was then created over the right Kocher's point.  The dura was coagulated.  Using standard anatomic landmarks a 7 cm ventricular catheter was then placed in the right frontal horn. Good clear CSF flow was obtained. The catheter was then connected to the valve apparatus.  The valve was then pumped, and good distal flow was observed.  The distal portion of the shunt was then placed in the intraperitoneal cavity under laparoscopic guidance by Dr. Kieth Brightly.  The details of this procedure are dictated in a separate report.  At this point the scalp wounds were irrigated with copious amounts of bacitracin irrigation, and closed using 3-0 Vicryl stitches.  The skin was then closed using standard surgical skin staples. Shunt was then tapped and CSF sent for routine analysis including VDRL. Sterile dressings were then applied.  At the end of the case all sponge needle and instrument counts were correct.  The patient tolerated the procedure well and was extubated in the room and taken to the postanesthesia care unit in stable condition.   Consuella Lose, MD [facility]

## 2022-07-02 NOTE — Transfer of Care (Signed)
Immediate Anesthesia Transfer of Care Note  Patient: Micheal Duncan  Procedure(s) Performed: LAPAROSCOPIC ASSISTED SHUNT INSERTION VENTRICULAR-PERITONEAL (Right) LAPAROSCOPIC VENTRICULAR-PERITONEAL (V-P) SHUNT  Patient Location: PACU  Anesthesia Type:General  Level of Consciousness: awake, oriented and patient cooperative  Airway & Oxygen Therapy: Patient Spontanous Breathing and Patient connected to nasal cannula oxygen  Post-op Assessment: Report given to RN and Post -op Vital signs reviewed and stable  Post vital signs: Reviewed  Last Vitals:  Vitals Value Taken Time  BP 165/70 07/02/22 0933  Temp    Pulse 61 07/02/22 0942  Resp 12 07/02/22 0942  SpO2 97 % 07/02/22 0942  Vitals shown include unvalidated device data.  Last Pain:  Vitals:   07/02/22 0622  TempSrc: Oral  PainSc:       Patients Stated Pain Goal: 3 (61/22/44 9753)  Complications:  Encounter Notable Events  Notable Event Outcome Phase Comment  Difficult to intubate - expected  Intraprocedure Filed from anesthesia note documentation.

## 2022-07-02 NOTE — Anesthesia Procedure Notes (Signed)
Procedure Name: Intubation Date/Time: 07/02/2022 8:08 AM  Performed by: Jenne Campus, CRNAPre-anesthesia Checklist: Patient identified, Emergency Drugs available, Suction available and Patient being monitored Patient Re-evaluated:Patient Re-evaluated prior to induction Oxygen Delivery Method: Circle System Utilized Preoxygenation: Pre-oxygenation with 100% oxygen Induction Type: IV induction and Rapid sequence Ventilation: Mask ventilation with difficulty, Oral airway inserted - appropriate to patient size and Two handed mask ventilation required Laryngoscope Size: Miller and 3 Grade View: Grade I Tube type: Oral Tube size: 7.5 mm Number of attempts: 1 Airway Equipment and Method: Stylet and Oral airway Placement Confirmation: ETT inserted through vocal cords under direct vision, positive ETCO2 and breath sounds checked- equal and bilateral Secured at: 24 cm Tube secured with: Tape Dental Injury: Teeth and Oropharynx as per pre-operative assessment  Difficulty Due To: Difficulty was anticipated Comments: Patient edentulous and has large beard.

## 2022-07-02 NOTE — Progress Notes (Signed)
Pt transferred from PACU to rm 4N01. Pt traveled via recliner, on 2L [place], A+OX4, incision sites looked good, no drainage. Pt was oriented to room and call bell system. Will CTM

## 2022-07-02 NOTE — Anesthesia Postprocedure Evaluation (Signed)
Anesthesia Post Note  Patient: Micheal Duncan  Procedure(s) Performed: LAPAROSCOPIC ASSISTED SHUNT INSERTION VENTRICULAR-PERITONEAL (Right) LAPAROSCOPIC VENTRICULAR-PERITONEAL (V-P) SHUNT     Patient location during evaluation: PACU Anesthesia Type: General Level of consciousness: awake and alert, patient cooperative and oriented Pain management: pain level controlled Vital Signs Assessment: post-procedure vital signs reviewed and stable Respiratory status: spontaneous breathing, nonlabored ventilation, respiratory function stable and patient connected to nasal cannula oxygen Cardiovascular status: blood pressure returned to baseline and stable Postop Assessment: no apparent nausea or vomiting Anesthetic complications: yes   Encounter Notable Events  Notable Event Outcome Phase Comment  Difficult to intubate - expected  Intraprocedure Filed from anesthesia note documentation.    Last Vitals:  Vitals:   07/02/22 1045 07/02/22 1100  BP: 120/62 113/65  Pulse: (!) 59 60  Resp: 13 12  Temp:    SpO2: 98% 98%    Last Pain:  Vitals:   07/02/22 1030  TempSrc:   PainSc: 2                  Gregory Barrick,E. Terez Freimark

## 2022-07-02 NOTE — Op Note (Signed)
Preoperative diagnosis: normal pressure hydrocephalus  Postoperative diagnosis: same   Procedure: laparoscopic assisted ventriculoperitoneal shunt placement  Surgeon: Gurney Maxin, M.D.  Co-Surgeon- Consuella Lose, M.D.  Anesthesia: general  Indications for procedure: Micheal Duncan is a 76 y.o. year old male with symptoms of hydrocephalus. He presents for shunt placement.  Description of procedure: The patient was brought into the operative suite. Anesthesia was administered with General endotracheal anesthesia. WHO checklist was applied. The patient was then placed in supine position. The area was prepped and draped in the usual sterile fashion.  Dr. Kathyrn Sheriff access the ventricle and tunneled a catheter from the cranial area to the upper abdomen in the subcutaneous tissue. See his note for details.  A left subcostal incision was made. A 28m trocar was used to gain access to the peritoneal cavity by optical entry technique. Pneumoperitoneum was applied with a high flow and low pressure. The laparoscope was reinserted to confirm position. Marcaine was infused into the right upper abdomen and the epigastrium to the transversus plane. An incision was made at the end of the subcutaneous path and peel away sheath inserted into the peritoneal cavity via Seldinger's technique under laparoscopic guidance. Once the catheter was proved to be appropriately placed into the ventricle and draining well, the catheter was placed into the lower abdomen via the peel away sheath. The pneumoperitoneum was evacuated. The epigastric incision was closed with deep dermal 3-0 vicryl and all abdominal incisions were closed with staples and dressings were placed   Findings: no adhesions of the abdominal cavity  Specimen: none  Implant: VP shunt   Blood loss: 5 ml  Local anesthesia:  20 ml marcaine   Complications: none  LGurney Maxin M.D. General, Bariatric, & Minimally Invasive Surgery [facility], [facility]

## 2022-07-02 NOTE — Progress Notes (Signed)
No diabetes order set entered due to patient taking 35 units novolog 70/30 at 0300 this am.

## 2022-07-02 NOTE — H&P (Signed)
Chief Complaint  NPH  History of Present Illness  Micheal Duncan is a 76 y.o. male initially seen in the outpatient neurosurgery clinic, evaluated for combination of gait instability, urinary incontinence, as well as slow progressive cognitive decline.  His imaging did reveal ventriculomegaly suggestive of normal pressure hydrocephalus.  He was referred for high-volume lumbar puncture which was performed and both he and his daughter report noticeable symptomatic improvement in his symptoms.  They therefore elected to proceed with placement of a ventriculoperitoneal shunt.  Of note, the patient was also being worked up for a possible diagnosis of neurosyphilis.  I have spoken with his infectious disease doctors at the [facility].  He does not feel that there is any contraindication to proceeding with placement of the shunt.  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   Chronic kidney disease    Complication of anesthesia    Fentanyl allergy   COPD (chronic obstructive pulmonary disease) (HCC)    Coronary artery disease    Depression    Diabetes mellitus    type 2   GERD (gastroesophageal reflux disease)    Hypercholesteremia    Hypertension    Memory loss    mild   Myocardial infarction ([place]) 2002   x several years - right before bypass surgery   Neuromuscular disorder (HCC)    PTSD (post-traumatic stress disorder)    Sleep apnea    does not use cpap   Stroke ([place]) 12/2021   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    Past Surgical History   Past Surgical History:  Procedure Laterality Date   CARPAL TUNNEL RELEASE     right   COLONOSCOPY     x several   CORONARY ARTERY BYPASS GRAFT     CORONARY STENT PLACEMENT     HAND SURGERY Bilateral    for skin cancer   MOHS SURGERY     x several @ [place]     UPPER GI ENDOSCOPY       Social History   Social History   Tobacco Use   Smoking status: Former    Packs/day: 0.50    Years: 45.00    Total pack years: 22.50    Types: Cigarettes    Quit date: 2018    Years since quitting: 5.5   Smokeless tobacco: Current   Tobacco comments:    Vapes  Vaping Use   Vaping Use: Every day   Substances: Flavoring  Substance Use Topics   Alcohol use: Not Currently    Comment: quit 14 yrs ago as of 06/2022   Drug use: Yes    Types: Cocaine    Comment: quit 14 yrs ago as of 06/2022    Medications   Prior to Admission medications   Medication Sig Start Date End Date Taking? Authorizing Provider  acetaminophen (TYLENOL) 500 MG tablet Take 500 mg by mouth every 6 (six) hours as needed for mild pain or moderate pain.   Yes [provider]  aspirin 81 MG chewable tablet Chew 81 mg by mouth daily.   Yes [provider]  atorvastatin (LIPITOR) 80 MG tablet Take 80 mg by mouth at bedtime.   Yes [provider]  buPROPion (WELLBUTRIN) 75 MG tablet Take 75 mg by  mouth daily. 05/08/22  Yes [provider]  cetirizine (ZYRTEC) 10 MG tablet Take 10 mg by mouth at bedtime.   Yes [provider]  clopidogrel (PLAVIX) 75 MG tablet Take 75 mg by mouth daily.   Yes [provider]  docusate sodium (COLACE) 50 MG capsule Take 100 mg by mouth daily.   Yes [provider]  FLUoxetine (PROZAC) 20 MG tablet Take 20 mg by mouth daily.   Yes [provider]  furosemide (LASIX) 40 MG tablet Take 40 mg by mouth daily as needed for fluid or edema.   Yes [provider]  insulin aspart protamine- aspart (NOVOLOG MIX 70/30) (70-30) 100 UNIT/ML injection Inject 50 Units into the skin 2 (two) times daily. 09/12/21  Yes [provider]  isosorbide mononitrate (IMDUR) 30 MG 24 hr tablet Take 30 mg by mouth daily.   Yes [provider]  LACTOBACILLUS PO Take 2 capsules by mouth every morning.   Yes [provider]  lisinopril (ZESTRIL) 5 MG tablet Take 5 mg by mouth daily.    Yes [provider]  metFORMIN (GLUCOPHAGE-XR) 500 MG 24 hr tablet Take 500 mg by mouth 2 (two) times daily. 03/06/22  Yes [provider]  methocarbamol (ROBAXIN) 500 MG tablet Take 500 mg by mouth 3 (three) times daily.   Yes [provider]  metoprolol succinate (TOPROL-XL) 25 MG 24 hr tablet Take 25 mg by mouth daily. 06/16/21  Yes [provider]  montelukast (SINGULAIR) 10 MG tablet Take 10 mg by mouth every morning.   Yes [provider]  nitroGLYCERIN (NITROSTAT) 0.4 MG SL tablet Place 0.4 mg under the tongue every 5 (five) minutes as needed for chest pain.   Yes [provider]  omeprazole (PRILOSEC) 20 MG capsule Take 40 mg by mouth every morning.   Yes [provider]  OVER THE COUNTER MEDICATION Place 1 application  into both eyes at bedtime as needed (dry eyes). Ocuvite eye ointment 3.5   Yes [provider]  oxybutynin (DITROPAN) 5 MG tablet Take 5 mg by mouth every morning.   Yes [provider]  prazosin (MINIPRESS) 2 MG capsule Take 4 mg by mouth at bedtime.   Yes [provider]  Propylene Glycol (SYSTANE BALANCE OP) Place 1 drop into both eyes 4 (four) times daily as needed (Dry eyes).   Yes [provider]  Semaglutide, 1 MG/DOSE, 4 MG/3ML SOPN Inject 1 mg into the skin once a week. Takes on Wednesday 09/12/21  Yes [provider]  thiamine 100 MG tablet Take 100 mg by mouth daily.   Yes [provider]  vitamin B-12 (CYANOCOBALAMIN) 500 MCG tablet Take 1,000 mcg by mouth daily.   Yes [provider]  albuterol (PROVENTIL HFA;VENTOLIN HFA) 108 (90 BASE) MCG/ACT inhaler Inhale 2 puffs into the lungs 4 (four) times daily as needed for wheezing or shortness of breath.    [provider]  albuterol (PROVENTIL) (2.5 MG/3ML) 0.083% nebulizer solution Take 1 ampule by nebulization  every 6 (six) hours as needed (COPD).    [provider]  fluticasone (FLONASE) 50 MCG/ACT nasal spray Place 2 sprays into both nostrils daily as needed for rhinitis or allergies.    [provider]    Allergies   Allergies  Allergen Reactions   Fentanyl Itching    Confusion, "skin crawling"   Duloxetine Other (See Comments)    Unknown   Gabapentin     Other  reaction(s): Edema    Review of Systems  ROS  Neurologic Exam  Awake, alert, oriented Memory and concentration grossly intact Speech fluent, appropriate CN grossly intact Motor exam: Upper Extremities Deltoid Bicep Tricep Grip  Right 5/5 5/5 5/5 5/5  Left 5/5 5/5 5/5 5/5   Lower Extremities IP Quad PF DF EHL  Right 5/5 5/5 5/5 5/5 5/5  Left 5/5 5/5 5/5 5/5 5/5   Sensation grossly intact to LT  Impression  - 76 y.o. male with combination of gait instability, urinary incontinence, and cognitive decline, suggestive of normal pressure hydrocephalus with symptomatic improvement with high-volume LP.  Plan  -We will plan on proceeding with placement of a laparoscopic assisted right sided ventriculoperitoneal shunt. -We will send routine CSF studies including CSF markers for syphilis.  I have reviewed the details of the operation at length in the office with the patient and his daughter.  We have discussed the expected postoperative course and recovery.  We have also discussed the associated risks, benefits, and alternatives to the surgery.  All his questions today were answered and he provided informed consent to proceed.  Consuella Lose, MD [facility]

## 2022-07-02 NOTE — H&P (Signed)
Micheal Duncan is an 76 y.o. male.   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   Chronic kidney disease    Complication of anesthesia    Fentanyl allergy   COPD (chronic obstructive pulmonary disease) (HCC)    Coronary artery disease    Depression    Diabetes mellitus    type 2   GERD (gastroesophageal reflux disease)    Hypercholesteremia    Hypertension    Memory loss    mild   Myocardial infarction ([place]) 2002   x several years - right before bypass surgery   Neuromuscular disorder ([place])    PTSD (post-traumatic stress disorder)    Sleep apnea    does not use cpap   Stroke ([place]) 12/2021   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    Past Surgical History:  Procedure Laterality Date   CARPAL TUNNEL RELEASE     right   COLONOSCOPY     x several   CORONARY ARTERY BYPASS GRAFT     CORONARY STENT PLACEMENT     HAND SURGERY Bilateral    for skin cancer   MOHS SURGERY     x several @ [place]     UPPER GI ENDOSCOPY      Family History  Problem Relation Age of Onset   Aneurysm Father    Diabetes Mother    Cancer Maternal Uncle        unknown to pt   Cancer Maternal Grandmother        ?stomach   Cancer Sister        ?stomach   Cancer Maternal Uncle        unknown to pt   Cancer Maternal Uncle        unknown to pt   Social History:  reports that he quit smoking about 5 years ago. His smoking use included cigarettes. He has a 22.50 pack-year smoking history. He uses smokeless tobacco. He reports that he does not currently use alcohol. He reports current drug use. Drug: Cocaine.  Allergies:  Allergies  Allergen Reactions   Fentanyl Itching    Confusion, "skin crawling"   Duloxetine Other (See Comments)    Unknown   Gabapentin      Other reaction(s): Edema    Medications Prior to Admission  Medication Sig Dispense Refill   acetaminophen (TYLENOL) 500 MG tablet Take 500 mg by mouth every 6 (six) hours as needed for mild pain or moderate pain.     aspirin 81 MG chewable tablet Chew 81 mg by mouth daily.     atorvastatin (LIPITOR) 80 MG tablet Take 80 mg by mouth at bedtime.     buPROPion (WELLBUTRIN) 75 MG tablet Take 75 mg by mouth daily.     cetirizine (ZYRTEC) 10 MG tablet Take 10 mg by mouth at bedtime.     clopidogrel (PLAVIX) 75 MG tablet Take 75 mg by mouth daily.     docusate sodium (COLACE) 50 MG capsule Take 100 mg by mouth daily.     FLUoxetine (PROZAC) 20 MG tablet Take 20 mg by mouth daily.     furosemide (LASIX) 40 MG tablet Take 40 mg by mouth daily as needed for fluid  or edema.     insulin aspart protamine- aspart (NOVOLOG MIX 70/30) (70-30) 100 UNIT/ML injection Inject 50 Units into the skin 2 (two) times daily.     isosorbide mononitrate (IMDUR) 30 MG 24 hr tablet Take 30 mg by mouth daily.     LACTOBACILLUS PO Take 2 capsules by mouth every morning.     lisinopril (ZESTRIL) 5 MG tablet Take 5 mg by mouth daily.      metFORMIN (GLUCOPHAGE-XR) 500 MG 24 hr tablet Take 500 mg by mouth 2 (two) times daily.     methocarbamol (ROBAXIN) 500 MG tablet Take 500 mg by mouth 3 (three) times daily.     metoprolol succinate (TOPROL-XL) 25 MG 24 hr tablet Take 25 mg by mouth daily.     montelukast (SINGULAIR) 10 MG tablet Take 10 mg by mouth every morning.     nitroGLYCERIN (NITROSTAT) 0.4 MG SL tablet Place 0.4 mg under the tongue every 5 (five) minutes as needed for chest pain.     omeprazole (PRILOSEC) 20 MG capsule Take 40 mg by mouth every morning.     OVER THE COUNTER MEDICATION Place 1 application  into both eyes at bedtime as needed (dry eyes). Ocuvite eye ointment 3.5     oxybutynin (DITROPAN) 5 MG tablet Take 5 mg by mouth every morning.     prazosin (MINIPRESS) 2 MG capsule Take 4 mg by mouth at bedtime.      Propylene Glycol (SYSTANE BALANCE OP) Place 1 drop into both eyes 4 (four) times daily as needed (Dry eyes).     Semaglutide, 1 MG/DOSE, 4 MG/3ML SOPN Inject 1 mg into the skin once a week. Takes on Wednesday     thiamine 100 MG tablet Take 100 mg by mouth daily.     vitamin B-12 (CYANOCOBALAMIN) 500 MCG tablet Take 1,000 mcg by mouth daily.     albuterol (PROVENTIL HFA;VENTOLIN HFA) 108 (90 BASE) MCG/ACT inhaler Inhale 2 puffs into the lungs 4 (four) times daily as needed for wheezing or shortness of breath.     albuterol (PROVENTIL) (2.5 MG/3ML) 0.083% nebulizer solution Take 1 ampule by nebulization every 6 (six) hours as needed (COPD).     fluticasone (FLONASE) 50 MCG/ACT nasal spray Place 2 sprays into both nostrils daily as needed for rhinitis or allergies.      Results for orders placed or performed during the hospital encounter of 07/02/22 (from the past 48 hour(s))  ABO/Rh     Status: None (Preliminary result)   Collection Time: 07/02/22  6:10 AM  Result Value Ref Range   ABO/RH(D) PENDING   Type and screen [place]     Status: None (Preliminary result)   Collection Time: 07/02/22  6:15 AM  Result Value Ref Range   ABO/RH(D) PENDING    Antibody Screen PENDING    Sample Expiration      07/05/2022,2359 Performed at [facility], [place] [address], [place], [place] [address]   Comprehensive metabolic panel per protocol     Status: Abnormal   Collection Time: 07/02/22  6:18 AM  Result Value Ref Range   Sodium 140 135 - 145 mmol/L   Potassium 3.7 3.5 - 5.1 mmol/L   Chloride 108 98 - 111 mmol/L   CO2 22 22 - 32 mmol/L   Glucose, Bld 192 (H) 70 - 99 mg/dL    Comment: Glucose reference range applies only to samples taken after fasting for at least 8 hours.   BUN  15 8 - 23 mg/dL   Creatinine, Ser 1.30 (H) 0.61 - 1.24 mg/dL   Calcium 8.5 (L) 8.9 - 10.3 mg/dL   Total Protein 6.1 (L) 6.5 - 8.1 g/dL   Albumin 3.5 3.5 - 5.0 g/dL   AST 33 15 - 41 U/L   ALT  52 (H) 0 - 44 U/L   Alkaline Phosphatase 100 38 - 126 U/L   Total Bilirubin 0.7 0.3 - 1.2 mg/dL   GFR, Estimated 58 (L) >60 mL/min    Comment: (NOTE) Calculated using the CKD-EPI Creatinine Equation (2021)    Anion gap 10 5 - 15    Comment: Performed at [facility], [place] [address] [place], [place] [address]  CBC per protocol     Status: Abnormal   Collection Time: 07/02/22  6:18 AM  Result Value Ref Range   WBC 5.2 4.0 - 10.5 K/uL   RBC 4.37 4.22 - 5.81 MIL/uL   Hemoglobin 13.5 13.0 - 17.0 g/dL   HCT 38.9 (L) 39.0 - 52.0 %   MCV 89.0 80.0 - 100.0 fL   MCH 30.9 26.0 - 34.0 pg   MCHC 34.7 30.0 - 36.0 g/dL   RDW 13.3 11.5 - 15.5 %   Platelets 174 150 - 400 K/uL   nRBC 0.0 0.0 - 0.2 %    Comment: Performed at [facility], [place] [address], [place], [place] [address]  Glucose, capillary     Status: Abnormal   Collection Time: 07/02/22  6:19 AM  Result Value Ref Range   Glucose-Capillary 188 (H) 70 - 99 mg/dL    Comment: Glucose reference range applies only to samples taken after fasting for at least 8 hours.   No results found.  Review of Systems  Constitutional: Negative.   HENT: Negative.    Eyes: Negative.   Respiratory: Negative.    Cardiovascular: Negative.   Gastrointestinal: Negative.   Endocrine: Negative.   Genitourinary: Negative.   Musculoskeletal: Negative.   Skin: Negative.   Allergic/Immunologic: Negative.   Neurological: Negative.   Hematological: Negative.   Psychiatric/Behavioral: Negative.      Blood pressure 121/63, pulse 73, temperature 97.7 F (36.5 C), temperature source Oral, resp. rate 18, height '5\' 8"'$  (1.727 m), weight 108.9 kg, SpO2 95 %. Physical Exam Vitals reviewed.  Constitutional:      Appearance: He is well-developed.  HENT:     Head: Normocephalic and atraumatic.  Eyes:     Conjunctiva/sclera: Conjunctivae normal.     Pupils: Pupils are equal, round, and reactive to light.  Cardiovascular:     Rate and Rhythm: Normal  rate and regular rhythm.  Pulmonary:     Effort: Pulmonary effort is normal.     Breath sounds: Normal breath sounds.  Abdominal:     General: Bowel sounds are normal. There is no distension.     Palpations: Abdomen is soft.     Tenderness: There is no abdominal tenderness.     Comments: Lap scars  Musculoskeletal:        General: Normal range of motion.     Cervical back: Normal range of motion and neck supple.  Skin:    General: Skin is warm and dry.  Neurological:     Mental Status: He is alert and oriented to person, place, and time.  Psychiatric:        Behavior: Behavior normal.      Assessment/Plan 76 yo male with hydrocephalus -lap assisted VP shunt  Charlotte Brafford  Sondra Come, MD 07/02/2022, 7:09 AM

## 2022-07-03 ENCOUNTER — Encounter (HOSPITAL_COMMUNITY): Payer: Self-pay | Admitting: Neurosurgery

## 2022-07-03 LAB — GLUCOSE, CAPILLARY: Glucose-Capillary: 153 mg/dL — ABNORMAL HIGH (ref 70–99)

## 2022-07-03 MED ORDER — CLOPIDOGREL BISULFATE 75 MG PO TABS: 75.0000 mg | ORAL_TABLET | Freq: Every day | ORAL | Status: DC

## 2022-07-03 MED ORDER — OXYCODONE-ACETAMINOPHEN 5-325 MG PO TABS: 1.0000 | ORAL_TABLET | Freq: Three times a day (TID) | ORAL | 0 refills | Status: AC | PRN

## 2022-07-03 NOTE — Discharge Summary (Signed)
Physician Discharge Summary  Patient ID: Micheal Duncan MRN: [id number] DOB/AGE: [birth year] 74 y.o.  Admit date: 07/02/2022 Discharge date: 07/03/2022  Admission Diagnoses:  Normal pressure hydrocephalus  Discharge Diagnoses:  Same Principal Problem:   Normal pressure hydrocephalus [facility])   Discharged Condition: Stable  Hospital Course:  Micheal Duncan is a 76 y.o. male admitted after uncomplicated right lap assisted VP shunt placement. He was at baseline postoperatively and requested d/c home.  Treatments: Surgery - right lap-assisted VP shunt placement  Discharge Exam: Blood pressure (!) 156/75, pulse 61, temperature 97.8 F (36.6 C), resp. rate 16, height '5\' 8"'$  (1.727 m), weight 108.9 kg, SpO2 96 %. Awake, alert, oriented Speech fluent, appropriate CN grossly intact 5/5 BUE/BLE Wound c/d/i  Disposition: Discharge disposition: 01-Home or Self Care       Discharge Instructions     Call MD for:  redness, tenderness, or signs of infection (pain, swelling, redness, odor or green/yellow discharge around incision site)   Complete by: As directed    Call MD for:  temperature >100.4   Complete by: As directed    Diet - low sodium heart healthy   Complete by: As directed    Discharge instructions   Complete by: As directed    Walk at home as much as possible, at least 4 times / day   Increase activity slowly   Complete by: As directed    Lifting restrictions   Complete by: As directed    No lifting > 10 lbs   May shower / Bathe   Complete by: As directed    48 hours after surgery   May walk up steps   Complete by: As directed    Other Restrictions   Complete by: As directed    No bending/twisting at waist   Remove dressing in 24 hours   Complete by: As directed       Allergies as of 07/03/2022       Reactions   Fentanyl Itching   Confusion, "skin crawling"   Duloxetine Other (See Comments)   Unknown   Gabapentin    Other reaction(s): Edema         Medication List     TAKE these medications    acetaminophen 500 MG tablet Commonly known as: TYLENOL Take 500 mg by mouth every 6 (six) hours as needed for mild pain or moderate pain.   albuterol (2.5 MG/3ML) 0.083% nebulizer solution Commonly known as: PROVENTIL Take 1 ampule by nebulization every 6 (six) hours as needed (COPD).   albuterol 108 (90 Base) MCG/ACT inhaler Commonly known as: VENTOLIN HFA Inhale 2 puffs into the lungs 4 (four) times daily as needed for wheezing or shortness of breath.   aspirin 81 MG chewable tablet Chew 81 mg by mouth daily.   atorvastatin 80 MG tablet Commonly known as: LIPITOR Take 80 mg by mouth at bedtime.   buPROPion 75 MG tablet Commonly known as: WELLBUTRIN Take 75 mg by mouth daily.   cetirizine 10 MG tablet Commonly known as: ZYRTEC Take 10 mg by mouth at bedtime.   clopidogrel 75 MG tablet Commonly known as: PLAVIX Take 1 tablet (75 mg total) by mouth daily. Start taking on: July 07, 2022 What changed: These instructions start on July 07, 2022. If you are unsure what to do until then, ask your doctor or other care provider.   docusate sodium 50 MG capsule Commonly known as: COLACE Take 100 mg by mouth daily.   FLUoxetine 20  MG tablet Commonly known as: PROZAC Take 20 mg by mouth daily.   fluticasone 50 MCG/ACT nasal spray Commonly known as: FLONASE Place 2 sprays into both nostrils daily as needed for rhinitis or allergies.   furosemide 40 MG tablet Commonly known as: LASIX Take 40 mg by mouth daily as needed for fluid or edema.   isosorbide mononitrate 30 MG 24 hr tablet Commonly known as: IMDUR Take 30 mg by mouth daily.   LACTOBACILLUS PO Take 2 capsules by mouth every morning.   lisinopril 5 MG tablet Commonly known as: ZESTRIL Take 5 mg by mouth daily.   metFORMIN 500 MG 24 hr tablet Commonly known as: GLUCOPHAGE-XR Take 500 mg by mouth 2 (two) times daily.   methocarbamol 500 MG  tablet Commonly known as: ROBAXIN Take 500 mg by mouth 3 (three) times daily.   metoprolol succinate 25 MG 24 hr tablet Commonly known as: TOPROL-XL Take 25 mg by mouth daily.   montelukast 10 MG tablet Commonly known as: SINGULAIR Take 10 mg by mouth every morning.   nitroGLYCERIN 0.4 MG SL tablet Commonly known as: NITROSTAT Place 0.4 mg under the tongue every 5 (five) minutes as needed for chest pain.   NovoLOG Mix 70/30 (70-30) 100 UNIT/ML injection Generic drug: insulin aspart protamine- aspart Inject 50 Units into the skin 2 (two) times daily.   omeprazole 20 MG capsule Commonly known as: PRILOSEC Take 40 mg by mouth every morning.   OVER THE COUNTER MEDICATION Place 1 application  into both eyes at bedtime as needed (dry eyes). Ocuvite eye ointment 3.5   oxybutynin 5 MG tablet Commonly known as: DITROPAN Take 5 mg by mouth every morning.   oxyCODONE-acetaminophen 5-325 MG tablet Commonly known as: Percocet Take 1 tablet by mouth every 8 (eight) hours as needed for up to 3 days for severe pain.   prazosin 2 MG capsule Commonly known as: MINIPRESS Take 4 mg by mouth at bedtime.   Semaglutide (1 MG/DOSE) 4 MG/3ML Sopn Inject 1 mg into the skin once a week. Takes on Wednesday   SYSTANE BALANCE OP Place 1 drop into both eyes 4 (four) times daily as needed (Dry eyes).   thiamine 100 MG tablet Take 100 mg by mouth daily.   vitamin B-12 500 MCG tablet Commonly known as: CYANOCOBALAMIN Take 1,000 mcg by mouth daily.        Follow-up Information     Consuella Lose, MD Follow up in 2 week(s).   Specialty: Neurosurgery Contact information: [address] [place] [address] [phone]                 Signed: Jairo Ben 07/03/2022, 11:09 AM

## 2022-07-03 NOTE — Progress Notes (Signed)
  NEUROSURGERY PROGRESS NOTE   No issues overnight. Pt without significant complaint this am. No real HA or visual changes.  EXAM:  BP (!) 156/75 (BP Location: Left Arm)   Pulse 61   Temp 97.8 F (36.6 C)   Resp 16   Ht '5\' 8"'$  (1.727 m)   Wt 108.9 kg   SpO2 96%   BMI 36.49 kg/m   Awake, alert, oriented  Speech fluent, appropriate  CN grossly intact  5/5 BUE/BLE   IMPRESSION:  76 y.o. male POD#1 s/p right lap-assisted VPS, doing well  PLAN: - Can d/c home today - Plan on restarting plavix in 4 days (Sat 07/07/22) - f/u in clinic in 2 weeks for staple removal   Consuella Lose, MD [facility]

## 2022-07-03 NOTE — TOC Progression Note (Signed)
Transition of Care [facility]) - Progression Note    Patient Details  Name: Micheal Duncan MRN: [id number] Date of Birth: January 05, 1946  Transition of Care [facility]) CM/SW [place], RN Phone Number:[phone]  07/03/2022, 2:19 PM  Clinical Narrative:      Transition of Care [facility]) Screening Note   Patient Details  Name: Micheal Duncan Date of Birth: [birth year]   Transition of Care [facility]) CM/SW Contact:    Angelita Ingles, RN Phone Number: 07/03/2022, 2:19 PM    Transition of Care Department [facility]) has reviewed patient and no TOC needs have been identified at this time. We will continue to monitor patient advancement through interdisciplinary progression rounds. If new patient transition needs arise, please place a TOC consult.         Expected Discharge Plan and Services           Expected Discharge Date: 07/03/22                                     Social Determinants of Health (SDOH) Interventions    Readmission Risk Interventions     No data to display

## 2022-07-04 LAB — VDRL, CSF: VDRL Quant, CSF: NONREACTIVE

## 2023-03-07 ENCOUNTER — Emergency Department (HOSPITAL_COMMUNITY)
Admission: EM | Admit: 2023-03-07 | Discharge: 2023-03-07 | Disposition: A | Discharge status: Home / Self Care | Payer: No Typology Code available for payment source | Attending: Emergency Medicine | Admitting: Emergency Medicine

## 2023-03-07 ENCOUNTER — Other Ambulatory Visit: Payer: Self-pay

## 2023-03-07 ENCOUNTER — Emergency Department (HOSPITAL_COMMUNITY): Payer: No Typology Code available for payment source

## 2023-03-07 ENCOUNTER — Encounter (HOSPITAL_COMMUNITY): Payer: Self-pay

## 2023-03-07 DIAGNOSIS — E119 Type 2 diabetes mellitus without complications: Secondary | ICD-10-CM | POA: Diagnosis not present

## 2023-03-07 DIAGNOSIS — Z79899 Other long term (current) drug therapy: Secondary | ICD-10-CM | POA: Diagnosis not present

## 2023-03-07 DIAGNOSIS — Z7984 Long term (current) use of oral hypoglycemic drugs: Secondary | ICD-10-CM | POA: Diagnosis not present

## 2023-03-07 DIAGNOSIS — Z7982 Long term (current) use of aspirin: Secondary | ICD-10-CM | POA: Insufficient documentation

## 2023-03-07 DIAGNOSIS — I6200 Nontraumatic subdural hemorrhage, unspecified: Secondary | ICD-10-CM | POA: Diagnosis present

## 2023-03-07 DIAGNOSIS — Z7902 Long term (current) use of antithrombotics/antiplatelets: Secondary | ICD-10-CM | POA: Diagnosis not present

## 2023-03-07 DIAGNOSIS — R7309 Other abnormal glucose: Secondary | ICD-10-CM | POA: Insufficient documentation

## 2023-03-07 DIAGNOSIS — Z794 Long term (current) use of insulin: Secondary | ICD-10-CM | POA: Insufficient documentation

## 2023-03-07 DIAGNOSIS — S065XAA Traumatic subdural hemorrhage with loss of consciousness status unknown, initial encounter: Secondary | ICD-10-CM

## 2023-03-07 LAB — CBG MONITORING, ED: Glucose-Capillary: 65 mg/dL — ABNORMAL LOW (ref 70–99)

## 2023-03-07 NOTE — ED Provider Notes (Signed)
[facility] Provider Note   CSN: [id number] Arrival date & time: 03/07/23  1534     History  Chief Complaint  Patient presents with  . Subdural Hematoma    Micheal Duncan is a 77 y.o. male.  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  Patient is in his baseline state of health.  He was having very significant difficulty with balance prior to his shunt placement.  He then had improvement, but still has imbalance especially with moving backwards.  He reports a fall where he hit his head around [place] and then he reports another fall about 2 months ago where he fell backwards and hit his head, needed to call family to help him get up off the ground.  He has not had any recent falls.  He has not had any vision changes or vomiting. Patient denies other signs of stroke including: facial droop, slurred speech, aphasia.  He has had chronic tingling in R arm for about 3 months.  He uses a cane to walk.  The CT was ordered today just to see what the shunt looked like due to recent falls.    CT performed today at [facility]): Brain: Interval placement of a right frontal approach ventricular  catheter crosses the midline with tip terminating in the frontal  horn left lateral ventricle. Interval development of 2.4 cm right subdural hematoma is predominantly low-density suggesting subacute or chronic nature, but with higher density products layering posteriorly which may be more recent. Additionally there is a smaller 0.6 cm left subdural hematoma containing mixed age blood products. Mass effect from the right subdural hematoma is causing approximately 0.5 cm right to left midline shift at the level of the foramina  of [place]. Overall reduced ventriculomegaly since placement of ventricular shunt. No evidence of acute large vascular territory infarct. No mass  lesion.         Home Medications Prior to Admission medications   Medication Sig Start Date End Date Taking? Authorizing Provider  acetaminophen (TYLENOL) 500 MG tablet Take 500 mg by mouth every 6 (six) hours as needed for mild pain or moderate pain.    [provider]  albuterol (PROVENTIL HFA;VENTOLIN HFA) 108 (90 BASE) MCG/ACT inhaler Inhale 2 puffs into the lungs 4 (four) times daily as needed for wheezing or shortness of breath.    [provider]  albuterol (PROVENTIL) (2.5 MG/3ML) 0.083% nebulizer solution Take 1 ampule by nebulization every 6 (six) hours as needed (COPD).    [provider]  aspirin 81 MG chewable tablet Chew 81 mg by mouth daily.    [provider]  atorvastatin (LIPITOR) 80 MG tablet Take 80 mg by mouth at bedtime.    [provider]  buPROPion (WELLBUTRIN) 75 MG tablet Take 75 mg by mouth daily. 05/08/22   [provider]  cetirizine (ZYRTEC) 10 MG tablet Take 10 mg by mouth at bedtime.    [provider]  clopidogrel (PLAVIX) 75 MG tablet Take 1 tablet (75 mg total) by mouth daily. 07/07/22   Consuella Lose, MD  docusate sodium (COLACE) 50 MG capsule Take 100 mg by mouth daily.    [provider]  FLUoxetine (PROZAC) 20 MG tablet Take 20 mg by mouth daily.  [provider]  fluticasone (FLONASE) 50 MCG/ACT nasal spray Place 2 sprays into both nostrils daily as needed for rhinitis or allergies.    [provider]  furosemide (LASIX) 40 MG tablet Take 40 mg by mouth daily as needed for fluid or edema.    [provider]  insulin aspart protamine- aspart (NOVOLOG MIX 70/30) (70-30) 100 UNIT/ML injection Inject 50 Units into the skin 2 (two) times daily. 09/12/21   [provider]  isosorbide mononitrate (IMDUR)  30 MG 24 hr tablet Take 30 mg by mouth daily.    [provider]  LACTOBACILLUS PO Take 2 capsules by mouth every morning.    [provider]  lisinopril (ZESTRIL) 5 MG tablet Take 5 mg by mouth daily.     [provider]  metFORMIN (GLUCOPHAGE-XR) 500 MG 24 hr tablet Take 500 mg by mouth 2 (two) times daily. 03/06/22   [provider]  methocarbamol (ROBAXIN) 500 MG tablet Take 500 mg by mouth 3 (three) times daily.    [provider]  metoprolol succinate (TOPROL-XL) 25 MG 24 hr tablet Take 25 mg by mouth daily. 06/16/21   [provider]  montelukast (SINGULAIR) 10 MG tablet Take 10 mg by mouth every morning.    [provider]  nitroGLYCERIN (NITROSTAT) 0.4 MG SL tablet Place 0.4 mg under the tongue every 5 (five) minutes as needed for chest pain.    [provider]  omeprazole (PRILOSEC) 20 MG capsule Take 40 mg by mouth every morning.    [provider]  OVER THE COUNTER MEDICATION Place 1 application  into both eyes at bedtime as needed (dry eyes). Ocuvite eye ointment 3.5    [provider]  oxybutynin (DITROPAN) 5 MG tablet Take 5 mg by mouth every morning.    [provider]  prazosin (MINIPRESS) 2 MG capsule Take 4 mg by mouth at bedtime.    [provider]  Propylene Glycol (SYSTANE BALANCE OP) Place 1 drop into both eyes 4 (four) times daily as needed (Dry eyes).    [provider]  Semaglutide, 1 MG/DOSE, 4 MG/3ML SOPN Inject 1 mg into the skin once a week. Takes on Wednesday 09/12/21   [provider]  thiamine 100 MG tablet Take 100 mg by mouth daily.    [provider]  vitamin B-12 (CYANOCOBALAMIN) 500 MCG tablet Take 1,000 mcg by mouth daily.    [provider]      Allergies    Fentanyl, Duloxetine, and Gabapentin    Review of Systems   Review of Systems  Physical Exam Updated Vital Signs BP (!) 166/86 (BP Location: Right Arm)    Pulse 70   Temp 98.6 F (37 C) (Oral)   Resp 18   SpO2 96%   Physical Exam Vitals and nursing note reviewed.  Constitutional:      Appearance: He is well-developed.  HENT:     Head: Normocephalic and atraumatic.     Right Ear: Tympanic membrane, ear canal and external ear normal.     Left Ear: Tympanic membrane, ear canal and external ear normal.     Nose: Nose normal.     Mouth/Throat:     Pharynx: Uvula midline.  Eyes:     General: Lids are normal.     Conjunctiva/sclera: Conjunctivae normal.     Pupils: Pupils are equal, round, and reactive to light.  Cardiovascular:     Rate and Rhythm: Normal rate and regular  rhythm.  Pulmonary:     Effort: Pulmonary effort is normal.     Breath sounds: Normal breath sounds.  Abdominal:     Palpations: Abdomen is soft.     Tenderness: There is no abdominal tenderness.  Musculoskeletal:        General: Normal range of motion.     Cervical back: Normal range of motion and neck supple. No tenderness or bony tenderness.  Skin:    General: Skin is warm and dry.  Neurological:     Mental Status: He is alert and oriented to person, place, and time.     GCS: GCS eye subscore is 4. GCS verbal subscore is 5. GCS motor subscore is 6.     Cranial Nerves: No cranial nerve deficit.     Sensory: No sensory deficit.     Motor: No abnormal muscle tone.     Coordination: Coordination normal.     Gait: Gait normal.     ED Results / Procedures / Treatments   Labs (all labs ordered are listed, but only abnormal results are displayed) Labs Reviewed  CBG MONITORING, ED - Abnormal; Notable for the following components:      Result Value   Glucose-Capillary 65 (*)    All other components within normal limits  CBG MONITORING, ED    EKG None  Radiology Personally reviewed and interpreted imaging.  I spoke personally with the radiologist who read the imaging.  Agree subdural hematoma on the right and left with slight midline shift.  This appears  similar to previous CT performed earlier in the day by the [place].  Procedures Procedures    Medications Ordered in ED Medications - No data to display  ED Course/ Medical Decision Making/ A&P Clinical Course as of 03/07/23 2125  Thu Mar 07, 2023  1722 Dr Christella Noa from neurosurgery consulted and would  [RP]    Clinical Course User Index [RP] Fransico Meadow, MD   Patient seen and examined. History obtained directly from patient and family member.  I was able to see CT radiology results in the computer as copied to HPI.  I was able to look at the CDs provided by patient.  I did speak with CT radiology techs and currently unable to upload the imaging.  I did take several pictures of the imaging and uploaded to media tab for reference.   Labs/EKG: Ordered CBG  Imaging: Ordered CT head without contrast  Medications/Fluids: None ordered  Most recent vital signs reviewed and are as follows: BP (!) 166/86 (BP Location: Right Arm)   Pulse 70   Temp 98.6 F (37 C) (Oral)   Resp 18   SpO2 96%   Initial impression: Patient with subdural hematoma without acute neuro deficits.  Per Dr. Shon Baton discussion with Dr. Christella Noa of neurosurgery -- requests re-imaging and likely d/c with outpatient follow-up if stable from earlier CT.   9:18 PM Reassessment performed. Patient appears stable.  Labs personally reviewed and interpreted including: Hypoglycemia, treated with juice and a sandwich  Imaging personally visualized and interpreted including: CT head, redemonstrates subdural hematoma.  It is essentially no different from my interpretation from CT performed earlier today.  Measurements in regards to the thickness of the hematomas as well as the midline shift, have slightly smaller measurements here.  Reviewed pertinent lab work and imaging with patient at bedside. Questions answered.   Most current vital signs reviewed and are as follows: BP (!) 168/79   Pulse 66   Temp  97.7 F (36.5  C) (Oral)   Resp 19   SpO2 100%   Plan: Discharge to home.   Prescriptions written for: None  Other home care instructions discussed: Hold aspirin and Plavix until instructed to resume by his providers.  ED return instructions discussed: I discussed very strict return precautions with patient and daughter at bedside. Patient counseled to return if they develop strokelike symptoms including weakness in their arms or legs, slurred speech, trouble walking or talking, confusion, trouble with their balance, or if they have any other concerns.  Also discussed return with vision change, vomiting, worsening confusion, worsening difficulty with ambulation.  Patient and daughter verbalize understanding and agrees with plan.    Follow-up instructions discussed: Patient encouraged to follow-up closely with his neurosurgeon, call in the morning for follow-up instructions and appointment.                           Medical Decision Making Amount and/or Complexity of Data Reviewed Radiology: ordered.   Patient on Plavix and aspirin presented today with subdural hematoma.  This was incidentally found on a CT scan which was done to check recent VP shunt by his doctor at the [place].  Patient has not had acute worsening of symptoms.  He has a dull headache which has been intermittent for several months.  He has had some numbness and tingling in his right arm which has also been present for several months.  He has not had worsening confusion, difficulty with ambulation.  His last significant fall was about 2 months ago and had another fall about 1 month prior to that.  Overall, his hematomas appear to be chronic.  He will hold his antiplatelet medications and follow-up closely as outpatient.  Neurosurgery recommendations obtained here tonight.  Patient is comfortable with discharged home.  Family member will help monitor him as well.  Strict return precautions discussed and they seem reliable to return if something  does acutely change or get worse.         Final Clinical Impression(s) / ED Diagnoses Final diagnoses:  Subdural hematoma [facility])    Rx / DC Orders ED Discharge Orders     None         Suann Larry 03/07/23 2125    Fransico Meadow, MD 03/08/23 [phone]

## 2023-03-07 NOTE — Discharge Instructions (Signed)
Please read and follow all provided instructions.  Your diagnoses today include:  1. Subdural hematoma (HCC)     Tests performed today include: CT of your head which showed again, the subdural hematoma noted at the [place] earlier today Blood sugar was slightly low Vital signs. See below for your results today.   Medications:  None  Additional information:  As we discussed, it is unclear how long the hematoma has been present, however most likely it has been there for several weeks.  Currently you have very minimal symptoms, but this will require very close follow-up as an outpatient.  Please call Dr. Cleotilde Neer office tomorrow morning to schedule a follow-up appointment.  Please temporarily hold and do not take your Plavix or aspirin until cleared to do so by your doctors.  Return instructions:  Please return to the Emergency Department if you experience worsening symptoms. Return if the medications do not resolve your headache, if it recurs, or if you have multiple episodes of vomiting or cannot keep down fluids. Return if you have a change from the usual headache. RETURN IMMEDIATELY IF you: Develop a sudden, severe headache Develop confusion or become poorly responsive or faint Develop a fever above 100.1F or problem breathing Have a change in speech, vision, swallowing, or understanding Develop new weakness, numbness, tingling, incoordination in your arms or legs Have a seizure Please return if you have any other emergent concerns.  Additional Information:  Your vital signs today were: BP (!) 168/79   Pulse 66   Temp 97.7 F (36.5 C) (Oral)   Resp 19   SpO2 100%  If your blood pressure (BP) was elevated above 135/85 this visit, please have this repeated by your doctor within one month. --------------

## 2023-03-07 NOTE — ED Triage Notes (Signed)
Pt came in via POV [facility]/t [facility] to come in [facility] d/t their CT showing a 2.5 cm subdural hematoma. Pt states he has fallen 4-5 times over the past year, did have a "brain shunt" placed July of 2023. Reports he does have some dull headaches often.

## 2023-03-11 ENCOUNTER — Telehealth: Payer: Self-pay

## 2023-03-11 NOTE — Telephone Encounter (Signed)
     Patient  visit on 3/28  at [facility]    Have you been able to follow up with your primary care physician? Yes   The patient was or was not able to obtain any needed medicine or equipment. Yes   Are there diet recommendations that you are having difficulty following? Na   Patient expresses understanding of discharge instructions and education provided has no other needs at this time.  Yes      [facility] [phone] [address], [place],  [address] Phone: [phone] Email: [email or web address]

## 2023-04-09 ENCOUNTER — Other Ambulatory Visit (HOSPITAL_COMMUNITY): Payer: Self-pay | Admitting: Neurosurgery

## 2023-04-09 ENCOUNTER — Other Ambulatory Visit: Payer: Self-pay | Admitting: Neurosurgery

## 2023-04-09 DIAGNOSIS — I6203 Nontraumatic chronic subdural hemorrhage: Secondary | ICD-10-CM

## 2023-04-15 NOTE — Progress Notes (Signed)
Surgical Instructions    Your procedure is scheduled on Tuesday Apr 23, 2023.  Report to [facility] Main Entrance "A" at 12:00 P.M., then check in with the Admitting office.  Call this number if you have problems the morning of surgery:  [phone]   If you have any questions prior to your surgery date call [phone]: Open Monday-Friday 8am-4pm If you experience any cold or flu symptoms such as cough, fever, chills, shortness of breath, etc. between now and your scheduled surgery, please notify us at the above number     Remember:  Do not eat after midnight the night before your surgery  You may drink clear liquids until 11:00 the morning of your surgery.   Clear liquids allowed are: Water, Non-Citrus Juices (without pulp), Carbonated Beverages, Clear Tea, Black Coffee ONLY (NO MILK, CREAM OR POWDERED CREAMER of any kind), and Gatorade    Take these medicines the morning of surgery with A SIP OF WATER:  buPROPion (WELLBUTRIN)  FLUoxetine (PROZAC)  isosorbide mononitrate (IMDUR)  methocarbamol (ROBAXIN)  metoprolol succinate (TOPROL-XL)  montelukast (SINGULAIR)  omeprazole (PRILOSEC)   If needed:  acetaminophen (TYLENOL)  albuterol Inhaler: please bring with you the day of surgery.  albuterol nebulizer  fluticasone (FLONASE)  nitroGLYCERIN (NITROSTAT)  Propylene Glycol eye drops  As of today, STOP taking any (unless otherwise instructed by your surgeon) Aleve, Naproxen, Ibuprofen, Motrin, Advil, Goody's, BC's, all herbal medications, fish oil, and all vitamins.  Follow your surgeon's instructions on when to stop Aspirin and Plavix. If no instructions were given by your surgeon then you will need to call the office to get those instructions.    Stop taking Ozempic 1 week prior to surgery. Last dose on ________  WHAT DO I DO ABOUT MY DIABETES MEDICATION?   Do not take metFORMIN (GLUCOPHAGE-XR)  the morning of surgery.  THE NIGHT BEFORE SURGERY, take 35 units of NOVOLOG  MIX 70/30 insulin.       THE MORNING OF SURGERY,do not take NOVOLOG MIX 70/30.  The day of surgery, do not take other diabetes injectables, including Byetta (exenatide), Bydureon (exenatide ER), Victoza (liraglutide), or Trulicity (dulaglutide).  If your CBG is greater than 220 mg/dL, you may take  of your sliding scale (correction) dose of insulin.   HOW TO MANAGE YOUR DIABETES BEFORE AND AFTER SURGERY  Why is it important to control my blood sugar before and after surgery? Improving blood sugar levels before and after surgery helps healing and can limit problems. A way of improving blood sugar control is eating a healthy diet by:  Eating less sugar and carbohydrates  Increasing activity/exercise  Talking with your doctor about reaching your blood sugar goals High blood sugars (greater than 180 mg/dL) can raise your risk of infections and slow your recovery, so you will need to focus on controlling your diabetes during the weeks before surgery. Make sure that the doctor who takes care of your diabetes knows about your planned surgery including the date and location.  How do I manage my blood sugar before surgery? Check your blood sugar at least 4 times a day, starting 2 days before surgery, to make sure that the level is not too high or low.  Check your blood sugar the morning of your surgery when you wake up and every 2 hours until you get to the Short Stay unit.  If your blood sugar is less than 70 mg/dL, you will need to treat for low blood sugar: Do not take insulin.  Treat a low blood sugar (less than 70 mg/dL) with  cup of clear juice (cranberry or apple), 4 glucose tablets, OR glucose gel. Recheck blood sugar in 15 minutes after treatment (to make sure it is greater than 70 mg/dL). If your blood sugar is not greater than 70 mg/dL on recheck, call [phone] for further instructions. Report your blood sugar to the short stay nurse when you get to Short Stay.  If you are  admitted to the hospital after surgery: Your blood sugar will be checked by the staff and you will probably be given insulin after surgery (instead of oral diabetes medicines) to make sure you have good blood sugar levels. The goal for blood sugar control after surgery is 80-180 mg/dL.   Special instructions:    Oral Hygiene is also important to reduce your risk of infection.  Remember - BRUSH YOUR TEETH THE MORNING OF SURGERY WITH YOUR REGULAR TOOTHPASTE   [facility]- Preparing For Surgery  Before surgery, you can play an important role. Because skin is not sterile, your skin needs to be as free of germs as possible. You can reduce the number of germs on your skin by washing with CHG (chlorahexidine gluconate) Soap before surgery.  CHG is an antiseptic cleaner which kills germs and bonds with the skin to continue killing germs even after washing.     Please do not use if you have an allergy to CHG or antibacterial soaps. If your skin becomes reddened/irritated stop using the CHG.  Do not shave (including legs and underarms) for at least 48 hours prior to first CHG shower. It is OK to shave your face.  Please follow these instructions carefully.     Shower the NIGHT BEFORE SURGERY and the MORNING OF SURGERY with CHG Soap.   If you chose to wash your hair, wash your hair first as usual with your normal shampoo. After you shampoo, rinse your hair and body thoroughly to remove the shampoo.  Then [employer] and genitals (private parts) with your normal soap and rinse thoroughly to remove soap.  After that Use CHG Soap as you would any other liquid soap. You can apply CHG directly to the skin and wash gently with a scrungie or a clean washcloth.   Apply the CHG Soap to your body ONLY FROM THE NECK DOWN.  Do not use on open wounds or open sores. Avoid contact with your eyes, ears, mouth and genitals (private parts). Wash Face and genitals (private parts)  with your normal soap.   Wash  thoroughly, paying special attention to the area where your surgery will be performed.  Thoroughly rinse your body with warm water from the neck down.  DO NOT shower/wash with your normal soap after using and rinsing off the CHG Soap.  Pat yourself dry with a CLEAN TOWEL.  Wear CLEAN PAJAMAS to bed the night before surgery  Place CLEAN SHEETS on your bed the night before your surgery  DO NOT SLEEP WITH PETS.   Day of Surgery:  Take a shower with CHG soap. Wear Clean/Comfortable clothing the morning of surgery Do not apply any deodorants/lotions.   Remember to brush your teeth WITH YOUR REGULAR TOOTHPASTE.  Do not wear jewelry or makeup. Do not wear lotions, powders, perfumes/cologne or deodorant. Do not shave 48 hours prior to surgery.  Men may shave face and neck. Do not bring valuables to the hospital. Do not wear nail polish, gel polish, artificial nails, or any other type of  covering on natural nails (fingers and toes) If you have artificial nails or gel coating that need to be removed by a nail salon, please have this removed prior to surgery. Artificial nails or gel coating may interfere with anesthesia's ability to adequately monitor your vital signs.  [facility] is not responsible for any belongings or valuables.    Do NOT Smoke (Tobacco/Vaping)  24 hours prior to your procedure  If you use a CPAP at night, you may bring your mask for your overnight stay.   Contacts, glasses, hearing aids, dentures or partials may not be worn into surgery, please bring cases for these belongings   For patients admitted to the hospital, discharge time will be determined by your treatment team.   Patients discharged the day of surgery will not be allowed to drive home, and someone needs to stay with them for 24 hours.   SURGICAL WAITING ROOM VISITATION Patients having surgery or a procedure may have no more than 2 support people in the waiting area - these visitors may rotate.    Children under the age of 36 must have an adult with them who is not the patient. If the patient needs to stay at the hospital during part of their recovery, the visitor guidelines for inpatient rooms apply. Pre-op nurse will coordinate an appropriate time for 1 support person to accompany patient in pre-op.  This support person may not rotate.   Please refer to [email or web address] for the visitor guidelines for Inpatients (after your surgery is over and you are in a regular room).    If you received a COVID test during your pre-op visit, it is requested that you wear a mask when out in public, stay away from anyone that may not be feeling well, and notify your surgeon if you develop symptoms. If you have been in contact with anyone that has tested positive in the last 10 days, please notify your surgeon.    Please read over the following fact sheets that you were given.

## 2023-04-16 ENCOUNTER — Encounter (HOSPITAL_COMMUNITY): Payer: Self-pay

## 2023-04-16 ENCOUNTER — Other Ambulatory Visit: Payer: Self-pay

## 2023-04-16 ENCOUNTER — Encounter (HOSPITAL_COMMUNITY)
Admission: RE | Admit: 2023-04-16 | Discharge: 2023-04-16 | Disposition: A | Discharge status: Home / Self Care | Payer: No Typology Code available for payment source | Source: Ambulatory Visit | Attending: Neurosurgery | Admitting: Neurosurgery

## 2023-04-16 VITALS — BP 152/79 | HR 64 | Temp 97.7°F | Resp 17 | Ht 68.0 in | Wt 232.0 lb

## 2023-04-16 DIAGNOSIS — R413 Other amnesia: Secondary | ICD-10-CM | POA: Insufficient documentation

## 2023-04-16 DIAGNOSIS — J449 Chronic obstructive pulmonary disease, unspecified: Secondary | ICD-10-CM | POA: Insufficient documentation

## 2023-04-16 DIAGNOSIS — Z01812 Encounter for preprocedural laboratory examination: Secondary | ICD-10-CM | POA: Diagnosis present

## 2023-04-16 DIAGNOSIS — Z886 Allergy status to analgesic agent status: Secondary | ICD-10-CM | POA: Diagnosis not present

## 2023-04-16 DIAGNOSIS — I1310 Hypertensive heart and chronic kidney disease without heart failure, with stage 1 through stage 4 chronic kidney disease, or unspecified chronic kidney disease: Secondary | ICD-10-CM | POA: Insufficient documentation

## 2023-04-16 DIAGNOSIS — Z79899 Other long term (current) drug therapy: Secondary | ICD-10-CM | POA: Diagnosis not present

## 2023-04-16 DIAGNOSIS — I129 Hypertensive chronic kidney disease with stage 1 through stage 4 chronic kidney disease, or unspecified chronic kidney disease: Secondary | ICD-10-CM | POA: Insufficient documentation

## 2023-04-16 DIAGNOSIS — I6203 Nontraumatic chronic subdural hemorrhage: Secondary | ICD-10-CM | POA: Insufficient documentation

## 2023-04-16 DIAGNOSIS — Z7985 Long-term (current) use of injectable non-insulin antidiabetic drugs: Secondary | ICD-10-CM | POA: Diagnosis not present

## 2023-04-16 DIAGNOSIS — E1122 Type 2 diabetes mellitus with diabetic chronic kidney disease: Secondary | ICD-10-CM | POA: Insufficient documentation

## 2023-04-16 DIAGNOSIS — Z87891 Personal history of nicotine dependence: Secondary | ICD-10-CM | POA: Diagnosis not present

## 2023-04-16 DIAGNOSIS — N189 Chronic kidney disease, unspecified: Secondary | ICD-10-CM | POA: Diagnosis not present

## 2023-04-16 DIAGNOSIS — Z85828 Personal history of other malignant neoplasm of skin: Secondary | ICD-10-CM | POA: Diagnosis not present

## 2023-04-16 DIAGNOSIS — Z794 Long term (current) use of insulin: Secondary | ICD-10-CM | POA: Diagnosis not present

## 2023-04-16 DIAGNOSIS — Z951 Presence of aortocoronary bypass graft: Secondary | ICD-10-CM | POA: Insufficient documentation

## 2023-04-16 DIAGNOSIS — Z955 Presence of coronary angioplasty implant and graft: Secondary | ICD-10-CM | POA: Insufficient documentation

## 2023-04-16 DIAGNOSIS — E7800 Pure hypercholesterolemia, unspecified: Secondary | ICD-10-CM | POA: Insufficient documentation

## 2023-04-16 DIAGNOSIS — Z01818 Encounter for other preprocedural examination: Secondary | ICD-10-CM

## 2023-04-16 DIAGNOSIS — Z982 Presence of cerebrospinal fluid drainage device: Secondary | ICD-10-CM | POA: Diagnosis not present

## 2023-04-16 DIAGNOSIS — Z7902 Long term (current) use of antithrombotics/antiplatelets: Secondary | ICD-10-CM | POA: Insufficient documentation

## 2023-04-16 DIAGNOSIS — Z7982 Long term (current) use of aspirin: Secondary | ICD-10-CM | POA: Insufficient documentation

## 2023-04-16 DIAGNOSIS — E119 Type 2 diabetes mellitus without complications: Secondary | ICD-10-CM

## 2023-04-16 DIAGNOSIS — I2510 Atherosclerotic heart disease of native coronary artery without angina pectoris: Secondary | ICD-10-CM | POA: Insufficient documentation

## 2023-04-16 DIAGNOSIS — Z8673 Personal history of transient ischemic attack (TIA), and cerebral infarction without residual deficits: Secondary | ICD-10-CM | POA: Diagnosis not present

## 2023-04-16 DIAGNOSIS — K219 Gastro-esophageal reflux disease without esophagitis: Secondary | ICD-10-CM | POA: Insufficient documentation

## 2023-04-16 DIAGNOSIS — G4733 Obstructive sleep apnea (adult) (pediatric): Secondary | ICD-10-CM | POA: Insufficient documentation

## 2023-04-16 HISTORY — DX: Dyspnea, unspecified: R06.00

## 2023-04-16 LAB — BASIC METABOLIC PANEL
Anion gap: 9 (ref 5–15)
BUN: 13 mg/dL (ref 8–23)
CO2: 27 mmol/L (ref 22–32)
Calcium: 8.8 mg/dL — ABNORMAL LOW (ref 8.9–10.3)
Chloride: 101 mmol/L (ref 98–111)
Creatinine, Ser: 1.21 mg/dL (ref 0.61–1.24)
GFR, Estimated: >60 mL/min (ref >60)
Glucose, Bld: 112 mg/dL — ABNORMAL HIGH (ref 70–99)
Potassium: 3.9 mmol/L (ref 3.5–5.1)
Sodium: 137 mmol/L (ref 135–145)

## 2023-04-16 LAB — CBC
HCT: 46.9 % (ref 39.0–52.0)
Hemoglobin: 15 g/dL (ref 13.0–17.0)
MCH: 28.7 pg (ref 26.0–34.0)
MCHC: 32 g/dL (ref 30.0–36.0)
MCV: 89.8 fL (ref 80.0–100.0)
Platelets: 173 10*3/uL (ref 150–400)
RBC: 5.22 MIL/uL (ref 4.22–5.81)
RDW: 13.6 % (ref 11.5–15.5)
WBC: 6.4 10*3/uL (ref 4.0–10.5)
nRBC: 0 % (ref 0.0–0.2)

## 2023-04-16 LAB — HEMOGLOBIN A1C
Hgb A1c MFr Bld: 7.3 % — ABNORMAL HIGH (ref 4.8–5.6)
Mean Plasma Glucose: 162.81 mg/dL

## 2023-04-16 LAB — GLUCOSE, CAPILLARY: Glucose-Capillary: 125 mg/dL — ABNORMAL HIGH (ref 70–99)

## 2023-04-16 LAB — SURGICAL PCR SCREEN
MRSA, PCR: NEGATIVE
Staphylococcus aureus: NEGATIVE

## 2023-04-16 NOTE — Progress Notes (Signed)
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  DM: Type II Fasting Blood Sugar - 100-120 Checks Blood Sugar multiple times a day. Patient has a continuous glucose monitor in the right arm.  Last dose of GLP1 agonist-  04/10/2023 GLP1 instructions: Hold Ozempic 7 days prior to procedure. Last dose on 04/10/2023  Blood Thinner Instructions: Patient has stopped taking plavix on 03/07/2023 due to ER visit for subdural Hematoma. Patient instructed in ER to hold plavix and aspirin until further notice.   ERAS Protcol - Yes PRE-SURGERY Ensure or G2- No drink  COVID TEST- N/A   Anesthesia review: Yes, cardiac history  Patient denies shortness of breath, fever, cough and chest pain at PAT appointment   All instructions explained to the patient, with a verbal understanding of the material. Patient agrees to go over the instructions while at home for a better understanding.The opportunity to ask questions was provided.

## 2023-04-17 NOTE — Anesthesia Preprocedure Evaluation (Addendum)
Anesthesia Evaluation  Patient identified by MRN, date of birth, ID band Patient awake    Reviewed: Allergy & Precautions, NPO status , Patient's Chart, lab work & pertinent test results, reviewed documented beta blocker date and time   History of Anesthesia Complications Negative for: history of anesthetic complications  Airway Mallampati: II  TM Distance: >3 FB Neck ROM: Full    Dental  (+) Edentulous Upper, Edentulous Lower, Dental Advisory Given   Pulmonary sleep apnea (no longer uses his CPAP) , COPD,  COPD inhaler, Patient abstained from smoking., former smoker   breath sounds clear to auscultation       Cardiovascular hypertension, Pt. on medications and Pt. on home beta blockers (-) angina + CAD, + Past MI, + Cardiac Stents and + CABG   Rhythm:Regular Rate:Normal  12/2021 ECHO: EF 62%, noormal LVF, norml RVF, no significant valvular abnormalities   Neuro/Psych  PSYCHIATRIC DISORDERS (PTSD) Anxiety Depression    Hydrocephalus s/p vp shunt. Falls with chronic SDH CVA, No Residual Symptoms    GI/Hepatic ,GERD  Medicated and Controlled,,(+)     substance abuse (no ETOH in 15 years)    Endo/Other  diabetes, Type 2, Insulin Dependent, Oral Hypoglycemic Agents  Morbid obesity  Renal/GU Renal InsufficiencyRenal disease     Musculoskeletal  (+) Arthritis ,    Abdominal  (+) + obese  Peds  Hematology  (+) Blood dyscrasia (Plavix)   Anesthesia Other Findings   Reproductive/Obstetrics                             Lab Results  Component Value Date   WBC 6.4 04/16/2023   HGB 15.0 04/16/2023   HCT 46.9 04/16/2023   MCV 89.8 04/16/2023   PLT 173 04/16/2023   Lab Results  Component Value Date   CREATININE 1.21 04/16/2023   BUN 13 04/16/2023   NA 137 04/16/2023   K 3.9 04/16/2023   CL 101 04/16/2023   CO2 27 04/16/2023    Anesthesia Physical Anesthesia Plan  ASA: 3  Anesthesia Plan:  General   Post-op Pain Management: Tylenol PO (pre-op)*   Induction: Intravenous  PONV Risk Score and Plan: 2 and Ondansetron and Dexamethasone  Airway Management Planned: Oral ETT  Additional Equipment: None  Intra-op Plan:   Post-operative Plan: Extubation in OR  Informed Consent: I have reviewed the patients History and Physical, chart, labs and discussed the procedure including the risks, benefits and alternatives for the proposed anesthesia with the patient or authorized representative who has indicated his/her understanding and acceptance.       Plan Discussed with: CRNA and Surgeon  Anesthesia Plan Comments: (  )        Anesthesia Quick Evaluation

## 2023-04-17 NOTE — Progress Notes (Signed)
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 By notes, he fell about March 2024. 03/07/23 Head CT scan showed acute on chronic left SDH and chronic right SDH and was referred back to neurosurgery. He has had some serial head imaging. He reported a sharp headache with N/T in the extremities. As of 04/08/23, VP shunt setting at 1.5.   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 He reported confusion, skin crawling, itching with fentanyl.   Last [facility] cardiology note seen is for 06/15/22 with Lorelle Gibbs, PA-C. CAD felt clinically stable. Per note, Mr. Lord has a "history of coronary artery disease, status post three-vessel CABG in 2008, drug-eluting stent to the ramus intermedius in 2012 receiving 2 stents at that time. Has been on chronic dual platelet therapy. Per his report they had significant difficulty placing the stents in 2012, and he was told to continue lifelong Plavix following this due to complicated stent placement." Sternal wire removed in July 2020 due to incisional pain. He was referred to [facility] CT surgery and they decided not to pursue further intervention. He had a fairly recent cardiac work-up for dizziness. Home cardiac monitor and echocardiogram were unremarkable and symptoms felt secondary to hydrocephalus  after symptoms improved following LP with removal of CSF. He was referred to VP shunt. Follow-up ~ six months planned.    A1c 7.3%. He wears a continuous glucose monitor. Last Ozempic 04/10/23 and instructed to hold until after surgery.   Anesthesia team to evaluate on the day of surgery. His ASA and Plavix have been on hold since finding of acute on chronic SDH on 03/07/23.    VS: BP (!) 152/79   Pulse 64   Temp 36.5 C   Resp 17   Ht 5\' 8"  (1.727 m)   Wt 105.2 kg   SpO2 100%   BMI 35.28 kg/m    PROVIDERS: Clinic, [facility]   LABS: Labs reviewed: Acceptable for surgery. (all labs ordered are listed, but only abnormal results are displayed)  Labs Reviewed  GLUCOSE, CAPILLARY - Abnormal; Notable for the following components:      Result Value   Glucose-Capillary 125 (*)    All other components within normal limits  HEMOGLOBIN A1C - Abnormal; Notable for the following components:   Hgb A1c MFr Bld 7.3 (*)    All other components within normal limits  BASIC METABOLIC PANEL - Abnormal; Notable for the following components:   Glucose, Bld 112 (*)    Calcium 8.8 (*)    All other components within normal limits  SURGICAL PCR SCREEN  CBC     IMAGES: CT Head 04/08/2023: Images in Canopy/PACS.  CT Head 03/07/23: IMPRESSION: 1. Acute on chronic left subdural hematoma measuring 3 mm. 2. Chronic right subdural hematoma measuring 2.1 cm. 3. 4 mm leftward midline shift. 4. Right frontal approach  shunt catheter with tip in the frontal horn of the left lateral ventricle. No hydrocephalus.    EKG: 07/02/2022: Normal sinus rhythm with sinus arrhythmia Nonspecific T wave abnormality Abnormal ECG When compared with ECG of 13-May-2012 10:11, No significant change was found Confirmed by Lorine Bears ([id number]) on 07/03/2022 10:04:51 PM   CV: TTE 12/21/2021 (as outlined in 06/15/22 [facility] Cardiology note, Care Everywhere): The left ventricle is normal in size. There is mild  concentric left ventricular  hypertrophy.Left ventricular systolic function is normal. EF= 62% by 2D biplane  method. No regional wall motion abnormalities noted. The right ventricle is normal in size and function. Borderline left atrial enlargement. Right atrial size is normal. The aortic valve is trileaflet. The aortic valve opens well. No aortic regurgitation is present. The mitral valve is grossly normal. There is trace mitral regurgitation. There is mild tricuspid regurgitation. Right ventricular systolic pressure is  estimated at 20 mmHg. The aortic root is normal size. There is no pericardial effusion. - Comparison 05/26/2020: Moderate concentric LVH, EF 55-60%, grade 1 DD   Holter/Zio patch monitoring 01/11/2022 (as outlined in 06/15/22 [facility] Cardiology note, Care Everywhere): Arrhythmia episodes: Rare isolated PVCs and 1 short run of nonsustained ventricular tachycardia  consisting of 5 beats occasional PACs and couplets and triplets of PACs including 22 short runs of  supraventricular tachycardia, longest SVT lasted for 20 beats with an average  rate of 127 bpm. Rare supraventricular ectopic beats correlated with a triggered event Patient triggered events: 6 and 2 diary entry Impression: Predominant underlying rhythm was sinus rhythm at a rate varying from 44  beats per minute minute to 105 bpm Rare isolated PVCs and 1 short run of nonsustained ventricular tachycardia  consisting of 5 beats Occasional PACs and couplets and triplets of PACs including 22 short runs of  supraventricular tachycardia, longest SVT lasted for 20 beats with an average  rate of 127 bpm. The VT and SVTs did not correlate with any triggered events Rare supraventricular ectopic beats correlated with a triggered event No atrial fibrillation or atrial flutter detected. No significant pauses or AV block detected. Normal heart rate variability throughout the day.   US Carotid 05/14/2012: Summary:  No  significant extracranial carotid artery stenosis  demonstrated. Veterbrals are patent with antegrade flow.    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   Chronic kidney disease    Complication of anesthesia    Fentanyl allergy   COPD (chronic obstructive pulmonary disease) (HCC)    Coronary artery disease    Depression    Diabetes mellitus    type 2   Dyspnea    GERD (gastroesophageal reflux disease)    Hypercholesteremia    Hypertension    Memory loss    mild   Myocardial infarction (HCC) 2002   x several years - right before bypass surgery   Neuromuscular disorder (HCC)    Pneumonia 11/2021   PTSD (post-traumatic stress disorder)    Sleep apnea    does not use cpap   Stroke (HCC) 12/2021   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    Past Surgical History:  Procedure Laterality Date   CARPAL TUNNEL RELEASE     right   COLONOSCOPY     x several   CORONARY ARTERY BYPASS GRAFT     CORONARY STENT  PLACEMENT     HAND SURGERY Bilateral    for skin cancer   LAPAROSCOPIC REVISION VENTRICULAR-PERITONEAL (V-P) SHUNT N/A 07/02/2022   Procedure: LAPAROSCOPIC VENTRICULAR-PERITONEAL (V-P) SHUNT;  Surgeon: Sheliah Hatch De Blanch, MD;  Location: [facility];  Service: General;  Laterality: N/A;   MOHS SURGERY     x several @ [facility]   THROAT SURGERY     UPPER GI ENDOSCOPY     VENTRICULOPERITONEAL SHUNT Right 07/02/2022   Procedure: LAPAROSCOPIC ASSISTED SHUNT INSERTION VENTRICULAR-PERITONEAL;  Surgeon: Lisbeth Renshaw, MD;  Location[facility];  Service: Neurosurgery;  Laterality: Right;  3C    MEDICATIONS:  acetaminophen (TYLENOL) 500 MG tablet   albuterol (PROVENTIL HFA;VENTOLIN HFA) 108 (90 BASE) MCG/ACT inhaler   albuterol (PROVENTIL) (2.5 MG/3ML) 0.083% nebulizer solution   aspirin 81 MG chewable tablet   atorvastatin (LIPITOR) 80 MG tablet   buPROPion (WELLBUTRIN) 75 MG tablet    cetirizine (ZYRTEC) 10 MG tablet   clopidogrel (PLAVIX) 75 MG tablet   FLUoxetine (PROZAC) 20 MG tablet   fluticasone (FLONASE) 50 MCG/ACT nasal spray   insulin aspart protamine- aspart (NOVOLOG MIX 70/30) (70-30) 100 UNIT/ML injection   isosorbide mononitrate (IMDUR) 30 MG 24 hr tablet   LACTOBACILLUS PO   lisinopril (ZESTRIL) 5 MG tablet   metFORMIN (GLUCOPHAGE-XR) 500 MG 24 hr tablet   methocarbamol (ROBAXIN) 500 MG tablet   metoprolol succinate (TOPROL-XL) 25 MG 24 hr tablet   montelukast (SINGULAIR) 10 MG tablet   nitroGLYCERIN (NITROSTAT) 0.4 MG SL tablet   omeprazole (PRILOSEC) 20 MG capsule   OVER THE COUNTER MEDICATION   oxybutynin (DITROPAN) 5 MG tablet   prazosin (MINIPRESS) 2 MG capsule   Propylene Glycol (SYSTANE BALANCE OP)   Semaglutide, 1 MG/DOSE, 4 MG/3ML SOPN   senna-docusate (SENOKOT-S) 8.6-50 MG tablet   thiamine 100 MG tablet   vitamin B-12 (CYANOCOBALAMIN) 500 MCG tablet   No current facility-administered medications for this encounter.    Shonna Chock, PA-C Surgical Short Stay/Anesthesiology [facility] Phone [phone] [facility] Phone [phone] 04/17/2023 4:10 PM

## 2023-04-19 IMAGING — RF DG SPINAL PUNCT LUMBAR DIAG WITH FL CT GUIDANCE
3 series · 3 of 3 positions shown · non-contrast
Comparison: Brain MRI 01/30/2022.

CLINICAL DATA: Normal pressure hydrocephalus. Request for
large-volume lumbar puncture.

EXAM:
DIAGNOSTIC LUMBAR PUNCTURE UNDER FLUOROSCOPIC GUIDANCE

[Series 1: fluoro_iodine 2fps_bw · 0.17mm/px · 1 of 1 slices shown]
[im 1/1]
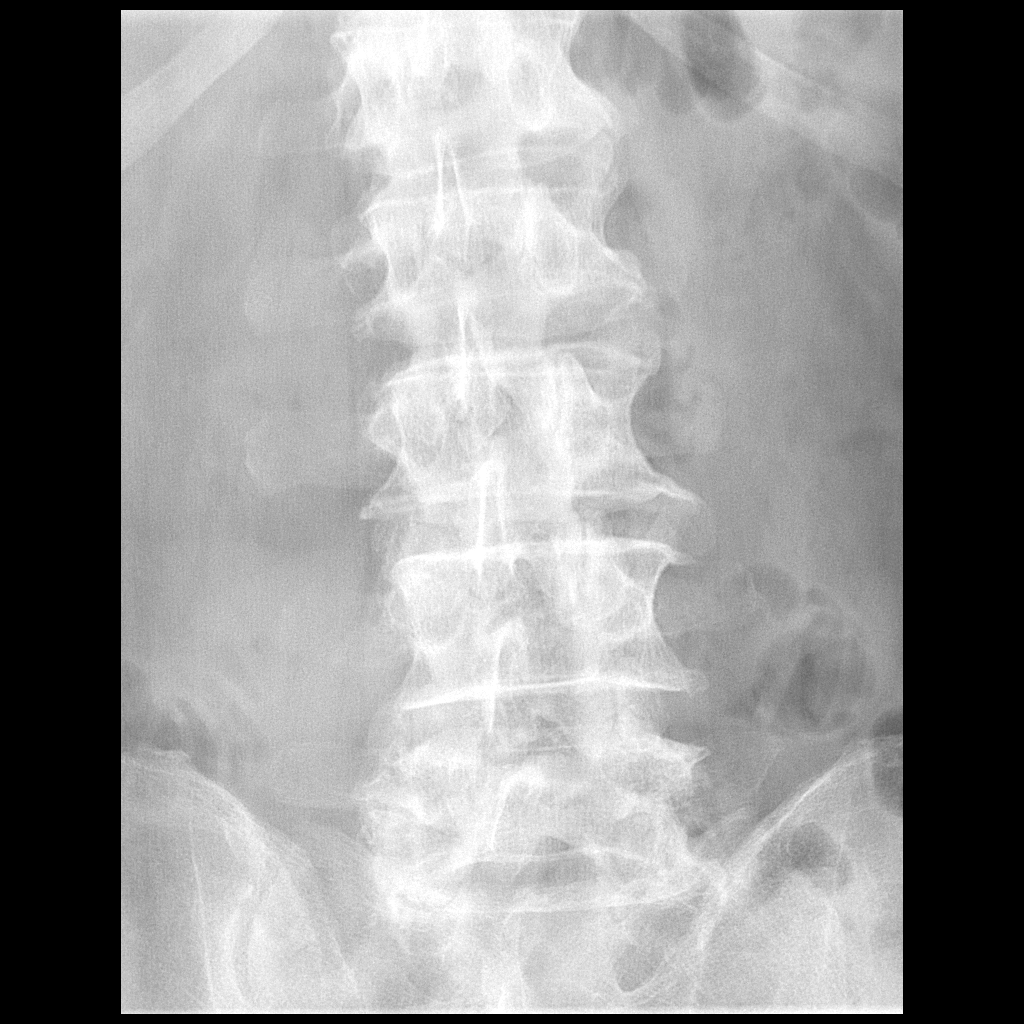

[Series 2: cp_standard · 0.17mm/px · 1 of 1 slices shown (1 of 2)]
[im 1/1]
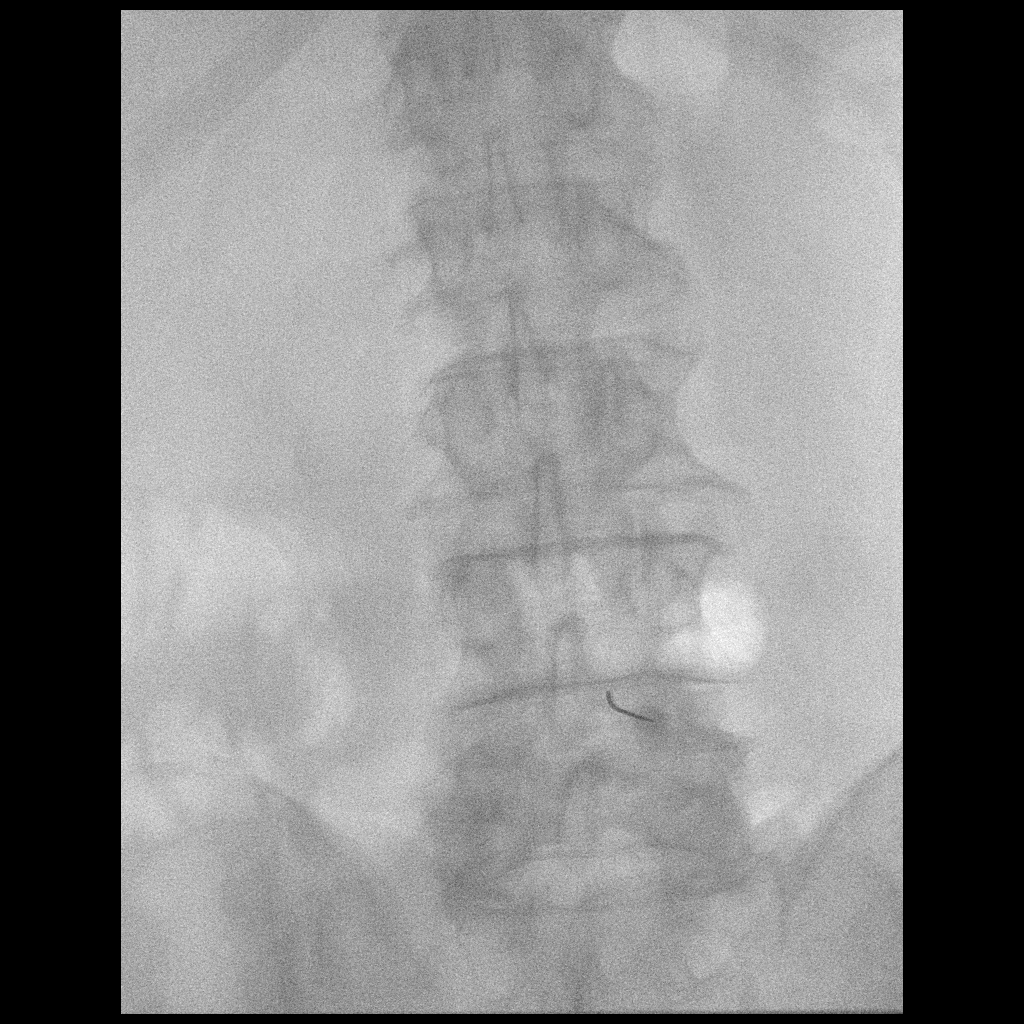

[Series 3: cp_standard · 0.17mm/px · 1 of 1 slices shown (2 of 2)]
[im 1/1]
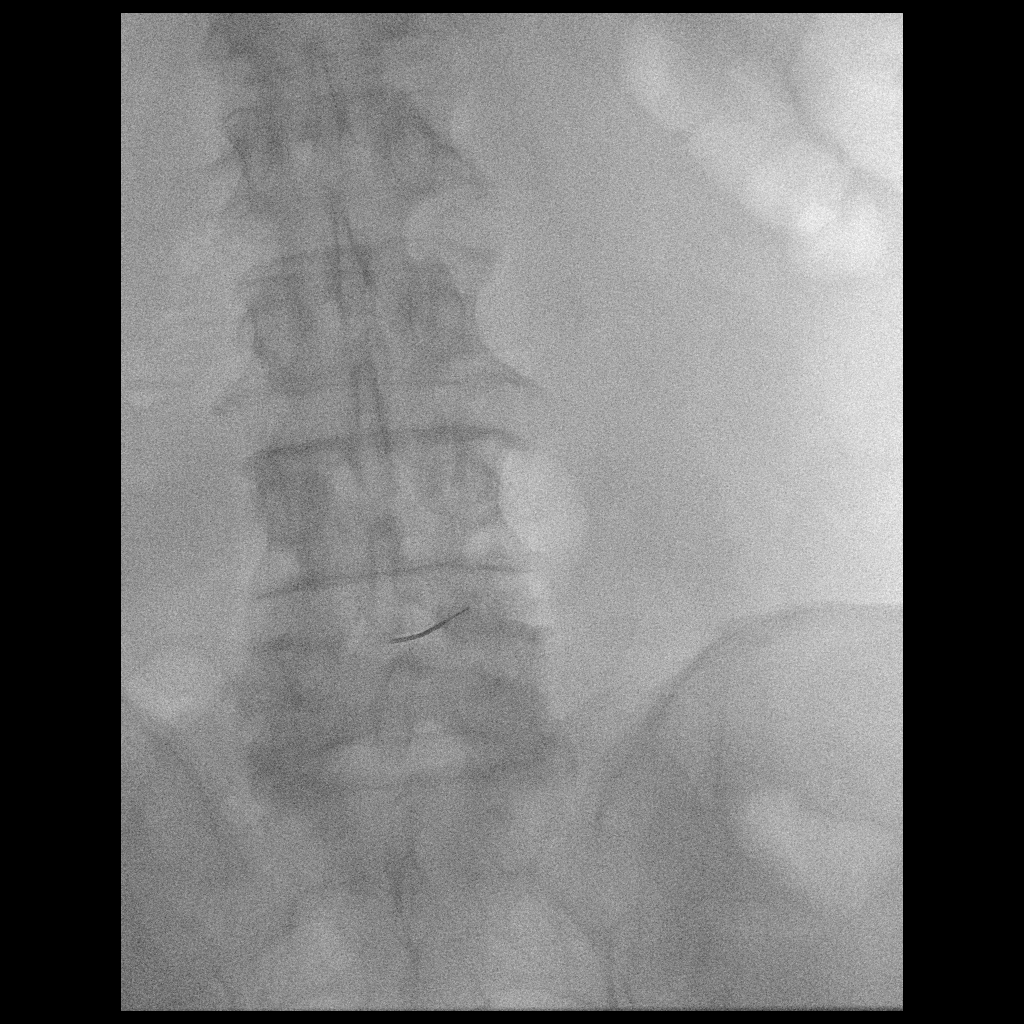

[3 of 3 positions shown; findings below may reference images not displayed]

FLUOROSCOPY:
Fluoroscopy time: 54 seconds.

Radiation Exposure Index (as provided by the fluoroscopic device):
15.30 mGy Kerma

PROCEDURE:
Prior to the procedure, informed consent was obtained from the
patient by [place], Adriianciithop. This process included a discussion
of procedural risks. With the patient prone, the lower back was
prepped with Betadine. 1% Lidocaine was used for local anesthesia.
Under fluoroscopic guidance, lumbar puncture was performed at the
L4-5 level using a 20 gauge needle with return of clear/minimally
blood-tinged CSF with an opening pressure of 16 cm water. Due to
poor CSF flow, the needle was removed and reinserted into the spinal
canal at the same level. 30 mL of CSF was obtained. The patient
tolerated the procedure well, and no immediate post-procedure
complication was apparent.

Procedure performed by Jonaline Shaa and supervised by Dr. Lore
Yessenia
IMPRESSION: Technically successful fluoroscopically-guided L4-L5 lumbar
puncture.

Opening pressure: 16 cm water.

30 mL of CSF obtained.

No immediate post-procedure complication.

## 2023-04-23 ENCOUNTER — Encounter (HOSPITAL_COMMUNITY): Payer: Self-pay | Admitting: Neurosurgery

## 2023-04-23 ENCOUNTER — Inpatient Hospital Stay (HOSPITAL_COMMUNITY): Payer: No Typology Code available for payment source | Admitting: Certified Registered Nurse Anesthetist

## 2023-04-23 ENCOUNTER — Inpatient Hospital Stay (HOSPITAL_COMMUNITY): Payer: No Typology Code available for payment source | Admitting: Vascular Surgery

## 2023-04-23 ENCOUNTER — Inpatient Hospital Stay (HOSPITAL_COMMUNITY)
Admission: RE | Admit: 2023-04-23 | Discharge: 2023-04-23 | Disposition: A | Discharge status: Home / Self Care | Payer: No Typology Code available for payment source | Source: Ambulatory Visit | Attending: Neurosurgery | Admitting: Neurosurgery

## 2023-04-23 ENCOUNTER — Encounter (HOSPITAL_COMMUNITY)
Disposition: A | Discharge status: Home / Self Care | Payer: Self-pay | Source: Home / Self Care | Attending: Neurosurgery

## 2023-04-23 ENCOUNTER — Inpatient Hospital Stay (HOSPITAL_COMMUNITY)
Admit: 2023-04-23 | Discharge: 2023-04-24 | DRG: 022 | Disposition: A | Discharge status: Home / Self Care | Payer: No Typology Code available for payment source | Attending: Neurosurgery | Admitting: Neurosurgery

## 2023-04-23 DIAGNOSIS — Z951 Presence of aortocoronary bypass graft: Secondary | ICD-10-CM | POA: Diagnosis not present

## 2023-04-23 DIAGNOSIS — Z87891 Personal history of nicotine dependence: Secondary | ICD-10-CM | POA: Diagnosis not present

## 2023-04-23 DIAGNOSIS — F32A Depression, unspecified: Secondary | ICD-10-CM | POA: Diagnosis present

## 2023-04-23 DIAGNOSIS — G4730 Sleep apnea, unspecified: Secondary | ICD-10-CM | POA: Diagnosis present

## 2023-04-23 DIAGNOSIS — Z7902 Long term (current) use of antithrombotics/antiplatelets: Secondary | ICD-10-CM

## 2023-04-23 DIAGNOSIS — F4310 Post-traumatic stress disorder, unspecified: Secondary | ICD-10-CM | POA: Diagnosis present

## 2023-04-23 DIAGNOSIS — I2510 Atherosclerotic heart disease of native coronary artery without angina pectoris: Secondary | ICD-10-CM | POA: Diagnosis present

## 2023-04-23 DIAGNOSIS — J449 Chronic obstructive pulmonary disease, unspecified: Secondary | ICD-10-CM | POA: Diagnosis present

## 2023-04-23 DIAGNOSIS — I252 Old myocardial infarction: Secondary | ICD-10-CM

## 2023-04-23 DIAGNOSIS — Z888 Allergy status to other drugs, medicaments and biological substances status: Secondary | ICD-10-CM

## 2023-04-23 DIAGNOSIS — I10 Essential (primary) hypertension: Secondary | ICD-10-CM | POA: Diagnosis present

## 2023-04-23 DIAGNOSIS — Z7982 Long term (current) use of aspirin: Secondary | ICD-10-CM | POA: Diagnosis not present

## 2023-04-23 DIAGNOSIS — Z794 Long term (current) use of insulin: Secondary | ICD-10-CM

## 2023-04-23 DIAGNOSIS — Z8673 Personal history of transient ischemic attack (TIA), and cerebral infarction without residual deficits: Secondary | ICD-10-CM

## 2023-04-23 DIAGNOSIS — Z85828 Personal history of other malignant neoplasm of skin: Secondary | ICD-10-CM

## 2023-04-23 DIAGNOSIS — Z955 Presence of coronary angioplasty implant and graft: Secondary | ICD-10-CM | POA: Diagnosis not present

## 2023-04-23 DIAGNOSIS — Z7984 Long term (current) use of oral hypoglycemic drugs: Secondary | ICD-10-CM

## 2023-04-23 DIAGNOSIS — Z885 Allergy status to narcotic agent status: Secondary | ICD-10-CM

## 2023-04-23 DIAGNOSIS — E119 Type 2 diabetes mellitus without complications: Secondary | ICD-10-CM | POA: Diagnosis present

## 2023-04-23 DIAGNOSIS — M1990 Unspecified osteoarthritis, unspecified site: Secondary | ICD-10-CM | POA: Diagnosis present

## 2023-04-23 DIAGNOSIS — Z79899 Other long term (current) drug therapy: Secondary | ICD-10-CM | POA: Diagnosis not present

## 2023-04-23 DIAGNOSIS — F17290 Nicotine dependence, other tobacco product, uncomplicated: Secondary | ICD-10-CM | POA: Diagnosis present

## 2023-04-23 DIAGNOSIS — I6203 Nontraumatic chronic subdural hemorrhage: Secondary | ICD-10-CM

## 2023-04-23 DIAGNOSIS — S065XAA Traumatic subdural hemorrhage with loss of consciousness status unknown, initial encounter: Secondary | ICD-10-CM | POA: Diagnosis present

## 2023-04-23 DIAGNOSIS — Z7985 Long-term (current) use of injectable non-insulin antidiabetic drugs: Secondary | ICD-10-CM

## 2023-04-23 DIAGNOSIS — K219 Gastro-esophageal reflux disease without esophagitis: Secondary | ICD-10-CM | POA: Diagnosis present

## 2023-04-23 DIAGNOSIS — E7800 Pure hypercholesterolemia, unspecified: Secondary | ICD-10-CM | POA: Diagnosis present

## 2023-04-23 DIAGNOSIS — Z982 Presence of cerebrospinal fluid drainage device: Secondary | ICD-10-CM | POA: Diagnosis not present

## 2023-04-23 DIAGNOSIS — R262 Difficulty in walking, not elsewhere classified: Secondary | ICD-10-CM | POA: Diagnosis present

## 2023-04-23 HISTORY — PX: IR ANGIO EXTERNAL CAROTID SEL EXT CAROTID UNI R MOD SED: IMG5371

## 2023-04-23 HISTORY — PX: IR ANGIOGRAM FOLLOW UP STUDY: IMG697

## 2023-04-23 HISTORY — PX: IR NEURO EACH ADD'L AFTER BASIC UNI RIGHT (MS): IMG5374

## 2023-04-23 HISTORY — PX: RADIOLOGY WITH ANESTHESIA: SHX6223

## 2023-04-23 HISTORY — PX: IR TRANSCATH/EMBOLIZ: IMG695

## 2023-04-23 HISTORY — PX: IR ANGIO INTRA EXTRACRAN SEL INTERNAL CAROTID UNI R MOD SED: IMG5362

## 2023-04-23 LAB — GLUCOSE, CAPILLARY
Glucose-Capillary: 152 mg/dL — ABNORMAL HIGH (ref 70–99)
Glucose-Capillary: 155 mg/dL — ABNORMAL HIGH (ref 70–99)
Glucose-Capillary: 130 mg/dL — ABNORMAL HIGH (ref 70–99)
Glucose-Capillary: 138 mg/dL — ABNORMAL HIGH (ref 70–99)
Glucose-Capillary: 166 mg/dL — ABNORMAL HIGH (ref 70–99)

## 2023-04-23 LAB — MRSA NEXT GEN BY PCR, NASAL: MRSA by PCR Next Gen: NOT DETECTED

## 2023-04-23 SURGERY — IR WITH ANESTHESIA
Anesthesia: General

## 2023-04-23 MED ORDER — HEPARIN SODIUM (PORCINE) 1000 UNIT/ML IJ SOLN
INTRAMUSCULAR | Status: DC | PRN
  Administered 2023-04-23: 3000 [IU] via INTRAVENOUS

## 2023-04-23 MED ORDER — ALBUTEROL SULFATE (2.5 MG/3ML) 0.083% IN NEBU: 3.0000 mL | INHALATION_SOLUTION | Freq: Four times a day (QID) | RESPIRATORY_TRACT | Status: DC | PRN

## 2023-04-23 MED ORDER — FENTANYL CITRATE (PF) 100 MCG/2ML IJ SOLN
INTRAMUSCULAR | Status: DC | PRN
  Administered 2023-04-23: 100 ug via INTRAVENOUS

## 2023-04-23 MED ORDER — CHLORHEXIDINE GLUCONATE 0.12 % MT SOLN
15.0000 mL | Freq: Once | OROMUCOSAL | Status: AC
  Administered 2023-04-23: 15 mL via OROMUCOSAL
  Filled 2023-04-23: qty 15

## 2023-04-23 MED ORDER — NITROGLYCERIN 0.4 MG SL SUBL: 0.4000 mg | SUBLINGUAL_TABLET | SUBLINGUAL | Status: DC | PRN

## 2023-04-23 MED ORDER — PANTOPRAZOLE SODIUM 40 MG PO TBEC
40.0000 mg | DELAYED_RELEASE_TABLET | Freq: Every day | ORAL | Status: DC
  Administered 2023-04-24: 40 mg via ORAL
  Filled 2023-04-23: qty 1

## 2023-04-23 MED ORDER — DEXAMETHASONE SODIUM PHOSPHATE 10 MG/ML IJ SOLN
INTRAMUSCULAR | Status: DC | PRN
  Administered 2023-04-23: 10 mg via INTRAVENOUS

## 2023-04-23 MED ORDER — ALBUTEROL SULFATE (2.5 MG/3ML) 0.083% IN NEBU: 2.5000 mg | INHALATION_SOLUTION | Freq: Four times a day (QID) | RESPIRATORY_TRACT | Status: DC | PRN

## 2023-04-23 MED ORDER — ORAL CARE MOUTH RINSE: 15.0000 mL | Freq: Once | OROMUCOSAL | Status: AC

## 2023-04-23 MED ORDER — ATORVASTATIN CALCIUM 80 MG PO TABS
80.0000 mg | ORAL_TABLET | Freq: Every day | ORAL | Status: DC
  Administered 2023-04-23: 80 mg via ORAL
  Filled 2023-04-23: qty 1

## 2023-04-23 MED ORDER — SODIUM CHLORIDE 0.9 % IV SOLN: INTRAVENOUS | Status: DC

## 2023-04-23 MED ORDER — ASPIRIN 81 MG PO CHEW: 81.0000 mg | CHEWABLE_TABLET | Freq: Every day | ORAL | Status: DC

## 2023-04-23 MED ORDER — INSULIN ASPART PROT & ASPART (70-30 MIX) 100 UNIT/ML ~~LOC~~ SUSP
45.0000 [IU] | Freq: Two times a day (BID) | SUBCUTANEOUS | Status: DC
  Administered 2023-04-24: 45 [IU] via SUBCUTANEOUS
  Filled 2023-04-23: qty 10

## 2023-04-23 MED ORDER — IOHEXOL 300 MG/ML  SOLN
100.0000 mL | Freq: Once | INTRAMUSCULAR | Status: AC | PRN
  Administered 2023-04-23: 15 mL via INTRA_ARTERIAL

## 2023-04-23 MED ORDER — PROPOFOL 10 MG/ML IV BOLUS
INTRAVENOUS | Status: DC | PRN
  Administered 2023-04-23: 160 mg via INTRAVENOUS

## 2023-04-23 MED ORDER — FLUOXETINE HCL 20 MG PO CAPS
20.0000 mg | ORAL_CAPSULE | Freq: Every day | ORAL | Status: DC
  Administered 2023-04-24: 20 mg via ORAL
  Filled 2023-04-23: qty 1

## 2023-04-23 MED ORDER — ROCURONIUM BROMIDE 10 MG/ML (PF) SYRINGE
PREFILLED_SYRINGE | INTRAVENOUS | Status: DC | PRN
  Administered 2023-04-23: 60 mg via INTRAVENOUS

## 2023-04-23 MED ORDER — CEFAZOLIN SODIUM-DEXTROSE 2-4 GM/100ML-% IV SOLN
2.0000 g | Freq: Once | INTRAVENOUS | Status: AC
  Administered 2023-04-23: 2 g via INTRAVENOUS

## 2023-04-23 MED ORDER — ISOSORBIDE MONONITRATE ER 30 MG PO TB24
30.0000 mg | ORAL_TABLET | Freq: Every day | ORAL | Status: DC
  Administered 2023-04-24: 30 mg via ORAL
  Filled 2023-04-23: qty 1

## 2023-04-23 MED ORDER — FLUTICASONE PROPIONATE 50 MCG/ACT NA SUSP: 2.0000 | Freq: Every day | NASAL | Status: DC | PRN

## 2023-04-23 MED ORDER — HYDROCODONE-ACETAMINOPHEN 5-325 MG PO TABS
1.0000 | ORAL_TABLET | ORAL | Status: DC | PRN
  Administered 2023-04-23: 2 via ORAL
  Filled 2023-04-23: qty 2

## 2023-04-23 MED ORDER — LORATADINE 10 MG PO TABS
10.0000 mg | ORAL_TABLET | Freq: Every day | ORAL | Status: DC
  Administered 2023-04-24: 10 mg via ORAL
  Filled 2023-04-23: qty 1

## 2023-04-23 MED ORDER — PRAZOSIN HCL 2 MG PO CAPS
4.0000 mg | ORAL_CAPSULE | Freq: Every day | ORAL | Status: DC
  Administered 2023-04-23: 4 mg via ORAL
  Filled 2023-04-23 (×2): qty 2

## 2023-04-23 MED ORDER — ONDANSETRON HCL 4 MG/2ML IJ SOLN
INTRAMUSCULAR | Status: DC | PRN
  Administered 2023-04-23: 4 mg via INTRAVENOUS

## 2023-04-23 MED ORDER — IOHEXOL 300 MG/ML  SOLN
50.0000 mL | Freq: Once | INTRAMUSCULAR | Status: AC | PRN
  Administered 2023-04-23: 45 mL via INTRA_ARTERIAL

## 2023-04-23 MED ORDER — FENTANYL CITRATE (PF) 100 MCG/2ML IJ SOLN
INTRAMUSCULAR | Status: AC
  Filled 2023-04-23: qty 2

## 2023-04-23 MED ORDER — HYDRALAZINE HCL 20 MG/ML IJ SOLN
10.0000 mg | Freq: Once | INTRAMUSCULAR | Status: AC
  Administered 2023-04-23: 10 mg via INTRAVENOUS

## 2023-04-23 MED ORDER — PHENYLEPHRINE 80 MCG/ML (10ML) SYRINGE FOR IV PUSH (FOR BLOOD PRESSURE SUPPORT)
PREFILLED_SYRINGE | INTRAVENOUS | Status: DC | PRN
  Administered 2023-04-23: 80 ug via INTRAVENOUS

## 2023-04-23 MED ORDER — LIDOCAINE 2% (20 MG/ML) 5 ML SYRINGE
INTRAMUSCULAR | Status: DC | PRN
  Administered 2023-04-23: 60 mg via INTRAVENOUS

## 2023-04-23 MED ORDER — CEFAZOLIN SODIUM-DEXTROSE 2-4 GM/100ML-% IV SOLN
INTRAVENOUS | Status: AC
  Filled 2023-04-23: qty 100

## 2023-04-23 MED ORDER — INSULIN ASPART 100 UNIT/ML IJ SOLN: 0.0000 [IU] | INTRAMUSCULAR | Status: DC | PRN

## 2023-04-23 MED ORDER — METHOCARBAMOL 500 MG PO TABS
500.0000 mg | ORAL_TABLET | Freq: Three times a day (TID) | ORAL | Status: DC
  Administered 2023-04-23 – 2023-04-24 (×2): 500 mg via ORAL
  Filled 2023-04-23 (×2): qty 1

## 2023-04-23 MED ORDER — HYDRALAZINE HCL 20 MG/ML IJ SOLN
INTRAMUSCULAR | Status: AC
  Filled 2023-04-23: qty 1

## 2023-04-23 MED ORDER — LISINOPRIL 5 MG PO TABS
5.0000 mg | ORAL_TABLET | Freq: Every day | ORAL | Status: DC
  Administered 2023-04-24: 5 mg via ORAL
  Filled 2023-04-23: qty 1

## 2023-04-23 MED ORDER — MONTELUKAST SODIUM 10 MG PO TABS
10.0000 mg | ORAL_TABLET | Freq: Every morning | ORAL | Status: DC
  Administered 2023-04-24: 10 mg via ORAL
  Filled 2023-04-23: qty 1

## 2023-04-23 MED ORDER — LACTATED RINGERS IV SOLN: INTRAVENOUS | Status: DC

## 2023-04-23 MED ORDER — ONDANSETRON HCL 4 MG/2ML IJ SOLN: 4.0000 mg | Freq: Once | INTRAMUSCULAR | Status: DC | PRN

## 2023-04-23 MED ORDER — SUGAMMADEX SODIUM 200 MG/2ML IV SOLN
INTRAVENOUS | Status: DC | PRN
  Administered 2023-04-23: 200 mg via INTRAVENOUS

## 2023-04-23 MED ORDER — PHENYLEPHRINE HCL-NACL 20-0.9 MG/250ML-% IV SOLN
INTRAVENOUS | Status: DC | PRN
  Administered 2023-04-23: 25 ug/min via INTRAVENOUS

## 2023-04-23 MED ORDER — SENNOSIDES-DOCUSATE SODIUM 8.6-50 MG PO TABS
2.0000 | ORAL_TABLET | Freq: Every day | ORAL | Status: DC
  Filled 2023-04-23: qty 2

## 2023-04-23 MED ORDER — ORAL CARE MOUTH RINSE
15.0000 mL | OROMUCOSAL | Status: DC | PRN
  Administered 2023-04-23: 15 mL via OROMUCOSAL

## 2023-04-23 MED ORDER — LABETALOL HCL 5 MG/ML IV SOLN
INTRAVENOUS | Status: DC | PRN
  Administered 2023-04-23 (×4): 10 mg via INTRAVENOUS

## 2023-04-23 MED ORDER — OXYBUTYNIN CHLORIDE 5 MG PO TABS
5.0000 mg | ORAL_TABLET | Freq: Every morning | ORAL | Status: DC
  Administered 2023-04-24: 5 mg via ORAL
  Filled 2023-04-23: qty 1

## 2023-04-23 MED ORDER — INSULIN ASPART 100 UNIT/ML IJ SOLN
0.0000 [IU] | Freq: Three times a day (TID) | INTRAMUSCULAR | Status: DC
  Administered 2023-04-24: 15 [IU] via SUBCUTANEOUS

## 2023-04-23 MED ORDER — BUPROPION HCL 75 MG PO TABS
75.0000 mg | ORAL_TABLET | Freq: Every day | ORAL | Status: DC
  Administered 2023-04-24: 75 mg via ORAL
  Filled 2023-04-23: qty 1

## 2023-04-23 MED ORDER — CLOPIDOGREL BISULFATE 75 MG PO TABS: 75.0000 mg | ORAL_TABLET | Freq: Every day | ORAL | Status: DC

## 2023-04-23 MED ORDER — FENTANYL CITRATE (PF) 100 MCG/2ML IJ SOLN: 25.0000 ug | INTRAMUSCULAR | Status: DC | PRN

## 2023-04-23 MED ORDER — METFORMIN HCL ER 500 MG PO TB24
500.0000 mg | ORAL_TABLET | Freq: Every day | ORAL | Status: DC
  Administered 2023-04-24: 500 mg via ORAL
  Filled 2023-04-23: qty 1

## 2023-04-23 MED ORDER — METOPROLOL SUCCINATE ER 25 MG PO TB24
25.0000 mg | ORAL_TABLET | Freq: Every day | ORAL | Status: DC
  Administered 2023-04-24: 25 mg via ORAL
  Filled 2023-04-23: qty 1

## 2023-04-23 MED ORDER — CHLORHEXIDINE GLUCONATE CLOTH 2 % EX PADS
6.0000 | MEDICATED_PAD | Freq: Every day | CUTANEOUS | Status: DC
  Administered 2023-04-23: 6 via TOPICAL

## 2023-04-23 NOTE — Progress Notes (Addendum)
Paged neurosurgery. RN requesting orders for patient. Basically no active orders upon patient arrival to 4N ICU. RN also notified neurosurgery answering service that patient's SBP is over 180.

## 2023-04-23 NOTE — Sedation Documentation (Signed)
Sheath removed and 6 fr angioseal deployed.

## 2023-04-23 NOTE — Transfer of Care (Signed)
**Note Micheal-Identified via Obfuscation** Immediate Anesthesia Transfer of Care Note  Patient: Micheal Duncan  Procedure(s) Performed: Right middle meningeal artery embolization  Patient Location: PACU  Anesthesia Type:General  Level of Consciousness: awake, drowsy, and patient cooperative  Airway & Oxygen Therapy: Patient Spontanous Breathing  Post-op Assessment: Report given to RN, Post -op Vital signs reviewed and stable, and Patient moving all extremities X 4  Post vital signs: Reviewed and stable  Last Vitals:  Vitals Value Taken Time  BP 128/73 04/23/23 1815  Temp 36.6 C 04/23/23 1815  Pulse 58 04/23/23 1818  Resp 16 04/23/23 1818  SpO2 100 % 04/23/23 1818  Vitals shown include unvalidated device data.  Last Pain:  Vitals:   04/23/23 1152  TempSrc:   PainSc: 0-No pain      Patients Stated Pain Goal: 0 (04/23/23 1152)  Complications: No notable events documented.

## 2023-04-23 NOTE — Progress Notes (Signed)
Dr. Danielle Dess returned page. Dr. Danielle Dess stated he was in contact with Dr. Conchita Paris and that Dr. Conchita Paris would be putting in orders.

## 2023-04-23 NOTE — Anesthesia Procedure Notes (Addendum)
Procedure Name: Intubation Date/Time: 04/23/2023 5:01 PM  Performed by: Gus Puma, CRNAPre-anesthesia Checklist: Patient identified, Emergency Drugs available, Suction available and Patient being monitored Patient Re-evaluated:Patient Re-evaluated prior to induction Oxygen Delivery Method: Circle System Utilized Preoxygenation: Pre-oxygenation with 100% oxygen Induction Type: IV induction Ventilation: Two handed mask ventilation required and Oral airway inserted - appropriate to patient size Laryngoscope Size: Mac and 4 Grade View: Grade I Tube type: Oral Tube size: 7.5 mm Number of attempts: 1 Airway Equipment and Method: Stylet and Oral airway Placement Confirmation: ETT inserted through vocal cords under direct vision, positive ETCO2 and breath sounds checked- equal and bilateral Secured at: 23 cm Tube secured with: Tape Dental Injury: Teeth and Oropharynx as per pre-operative assessment

## 2023-04-23 NOTE — H&P (Signed)
Chief Complaint   Subdural Hematoma  History of Present Illness  [name] is a 77 year old male I am seeing in follow-up. He previously underwent placement of a ventriculoperitoneal shunt, but he was last seen a few weeks ago with discovery of a chronic right convexity subdural hematoma. He was largely asymptomatic. I did order a follow-up scan which has been completed. He remains largely asymptomatic although he does note onset of a sharp headache on the right side yesterday. No associated numbness tingling or weakness of the extremities. He remains ambulatory. At our last visit I did increase his shunt setting to 2.0 however he had a few episodes of confusion and difficulty walking after this. They feel that he was doing much better at a setting of 1.5. With enlargement of his SDH, MMA embolization was recommended.  Past Medical History   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   Chronic kidney disease    Complication of anesthesia    Fentanyl allergy   COPD (chronic obstructive pulmonary disease) (HCC)    Coronary artery disease    Depression    Diabetes mellitus    type 2   Dyspnea    GERD (gastroesophageal reflux disease)    Hypercholesteremia    Hypertension    Memory loss    mild   Myocardial infarction (HCC) 2002   x several years - right before bypass surgery   Neuromuscular disorder (HCC)    Pneumonia 11/2021   PTSD (post-traumatic stress disorder)    Sleep apnea    does not use cpap   Stroke (HCC) 12/2021   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    Past Surgical History   Past Surgical History:  Procedure Laterality Date   CARPAL TUNNEL RELEASE     right   COLONOSCOPY     x several   CORONARY ARTERY BYPASS GRAFT     CORONARY STENT PLACEMENT     HAND SURGERY Bilateral    for skin cancer   LAPAROSCOPIC REVISION VENTRICULAR-PERITONEAL (V-P) SHUNT N/A  07/02/2022   Procedure: LAPAROSCOPIC VENTRICULAR-PERITONEAL (V-P) SHUNT;  Surgeon: Sheliah Hatch De Blanch, MD;  Location[facility];  Service: General;  Laterality: N/A;   MOHS SURGERY     x several @ [facility]   THROAT SURGERY     UPPER GI ENDOSCOPY     VENTRICULOPERITONEAL SHUNT Right 07/02/2022   Procedure: LAPAROSCOPIC ASSISTED SHUNT INSERTION VENTRICULAR-PERITONEAL;  Surgeon: Lisbeth Renshaw, MD;  Location[facility] OR;  Service: Neurosurgery;  Laterality: Right;  3C    Social History   Social History   Tobacco Use   Smoking status: Former    Packs/day: 0.50    Years: 45.00    Additional pack years: 0.00    Total pack years: 22.50    Types: Cigarettes    Quit date: 2018    Years since quitting: 6.3   Smokeless tobacco: Former   Tobacco comments:    Press photographer Use: Every day   Substances: Flavoring  Substance Use Topics   Alcohol use: Not Currently    Comment: quit 14 yrs ago as of 06/2022   Drug use: Yes    Types: Cocaine    Comment: quit 15 yrs ago as of 06/2022    Medications   Prior to Admission medications  Medication Sig Start Date End Date Taking? Authorizing Provider  acetaminophen (TYLENOL) 500 MG tablet Take 500 mg by mouth every 6 (six) hours as needed for mild pain or moderate pain.   Yes [provider]  aspirin 81 MG chewable tablet Chew 81 mg by mouth daily.   Yes [provider]  atorvastatin (LIPITOR) 80 MG tablet Take 80 mg by mouth at bedtime.   Yes [provider]  buPROPion (WELLBUTRIN) 75 MG tablet Take 75 mg by mouth daily. 05/08/22  Yes [provider]  cetirizine (ZYRTEC) 10 MG tablet Take 10 mg by mouth at bedtime.   Yes [provider]  FLUoxetine (PROZAC) 20 MG tablet Take 20 mg by mouth daily.   Yes [provider]  fluticasone (FLONASE) 50 MCG/ACT nasal spray Place 2 sprays into both nostrils daily as needed for rhinitis or allergies.   Yes [provider]  insulin  aspart protamine- aspart (NOVOLOG MIX 70/30) (70-30) 100 UNIT/ML injection Inject 45-50 Units into the skin 2 (two) times daily. 09/12/21  Yes [provider]  isosorbide mononitrate (IMDUR) 30 MG 24 hr tablet Take 30 mg by mouth daily.   Yes [provider]  LACTOBACILLUS PO Take 2 capsules by mouth every morning.   Yes [provider]  lisinopril (ZESTRIL) 5 MG tablet Take 5 mg by mouth daily.    Yes [provider]  metFORMIN (GLUCOPHAGE-XR) 500 MG 24 hr tablet Take 500 mg by mouth daily. 03/06/22  Yes [provider]  methocarbamol (ROBAXIN) 500 MG tablet Take 500 mg by mouth in the morning and at bedtime.   Yes [provider]  metoprolol succinate (TOPROL-XL) 25 MG 24 hr tablet Take 25 mg by mouth daily. 06/16/21  Yes [provider]  montelukast (SINGULAIR) 10 MG tablet Take 10 mg by mouth every morning.   Yes [provider]  nitroGLYCERIN (NITROSTAT) 0.4 MG SL tablet Place 0.4 mg under the tongue every 5 (five) minutes as needed for chest pain.   Yes [provider]  omeprazole (PRILOSEC) 20 MG capsule Take 40 mg by mouth every morning.   Yes [provider]  OVER THE COUNTER MEDICATION Place 1 application  into both eyes at bedtime as needed (dry eyes). Ocuvite eye ointment 3.5   Yes [provider]  oxybutynin (DITROPAN) 5 MG tablet Take 5 mg by mouth every morning.   Yes [provider]  prazosin (MINIPRESS) 2 MG capsule Take 4 mg by mouth at bedtime.   Yes [provider]  Propylene Glycol (SYSTANE BALANCE OP) Place 1 drop into both eyes 4 (four) times daily as needed (Dry eyes).   Yes [provider]  Semaglutide, 1 MG/DOSE, 4 MG/3ML SOPN Inject 1 mg into the skin once a week. 09/12/21  Yes [provider]  senna-docusate (SENOKOT-S) 8.6-50 MG tablet Take 2 tablets by mouth daily.   Yes [provider]  thiamine 100 MG tablet Take 100 mg by mouth  daily.   Yes [provider]  vitamin B-12 (CYANOCOBALAMIN) 500 MCG tablet Take 1,000 mcg by mouth daily.   Yes [provider]  albuterol (PROVENTIL HFA;VENTOLIN HFA) 108 (90 BASE) MCG/ACT inhaler Inhale 2 puffs into the lungs 4 (four) times daily as needed for wheezing or shortness of breath.    [provider]  albuterol (PROVENTIL) (2.5 MG/3ML) 0.083% nebulizer solution Take 1 ampule by nebulization every 6 (six) hours as needed (COPD).    [provider]  clopidogrel (PLAVIX) 75 MG tablet Take 1 tablet (75 mg total) by mouth daily. 07/07/22   Lisbeth Renshaw, MD    Allergies   Allergies  Allergen Reactions   Fentanyl Itching    Confusion, "skin crawling"   Duloxetine Other (See Comments)    Unknown   Gabapentin Swelling    Review of Systems  ROS  Neurologic Exam  Awake, alert, oriented Memory and concentration grossly intact Speech fluent, appropriate CN grossly intact Motor exam: Upper Extremities Deltoid Bicep Tricep Grip  Right 5/5 5/5 5/5 5/5  Left 5/5 5/5 5/5 5/5   Lower Extremities IP Quad PF DF EHL  Right 5/5 5/5 5/5 5/5 5/5  Left 5/5 5/5 5/5 5/5 5/5   Sensation grossly intact to LT  Imaging  Progressive enlargement of chronic right SDH with associated local mass effect and mild L->R shift  Impression  - 77 y.o. male with largely asymptomatic enlarging right SDH  Plan  - Will proceed with right MMA embolization  I have reviewed the indications for the procedure as well as the details of the procedure and the expected postoperative course and recovery at length with the patient and his daughter in the office. We have also reviewed in detail the risks, benefits, and alternatives to the procedure. All questions were answered and Allen Kell provided informed consent to proceed.  Lisbeth Renshaw, MD [facility]

## 2023-04-23 NOTE — Op Note (Signed)
  NEUROSURGERY BRIEF OPERATIVE  NOTE   PREOP DX: Subdural Hematoma  POSTOP DX: Same  PROCEDURE: Diagnostic cerebral angiogram  SURGEON: Dr. Lisbeth Renshaw, MD  ANESTHESIA: GETA  APPROACH: Right trans-femoral  EBL: Minimal  SPECIMENS: None  COMPLICATIONS: None  CONDITION: Stable to recovery  FINDINGS (Full report in CanopyPACS): 1. Successful Onyx embolization of right MMA for cSDH   Lisbeth Renshaw, MD [facility]

## 2023-04-23 NOTE — Progress Notes (Signed)
eLink Physician-Brief Progress Note Patient Name: STATEN SALMO DOB: 09-Oct-1946 MRN: [id number]   Date of Service  04/23/2023  HPI/Events of Note  Patient with enlarging sub-dural hematoma s/p IR embolization of bleeding vessel, admitted to the ICU post-procedure for close monitoring.  eICU Interventions  New Patient Evaluation.        Thomasene Lot Fitzgerald Dunne 04/23/2023, 8:10 PM

## 2023-04-23 NOTE — Progress Notes (Signed)
RN called neurosurgery after hours on call line. RN explained to answering service that RN had already spoke with on call MD Elsner and Elsner said to contact Dr. Conchita Paris for orders. RN told answering service that patient needs active orders. RN told answering service patient's SBP is greater than 180. RN told answering service that RN does not see in a note or order for how long patient should remain flat/bedrest post procedure. Awaiting new orders.

## 2023-04-23 NOTE — Progress Notes (Signed)
On call neurosurgeon Dr. Danielle Dess returned page. MD stated to get in contact with Dr. Conchita Paris for orders. RN attempted to contacted Dr. Conchita Paris directly. No response. Will page neurosurgery answering service again.

## 2023-04-23 NOTE — Sedation Documentation (Signed)
Right groin site level 0, gauze and tegaderm is clean/dry/intact. Pulses remain unchanged. Handoff with PACU RN given.

## 2023-04-23 NOTE — Anesthesia Procedure Notes (Signed)
Arterial Line Insertion Start/End5/14/2024 1:45 PM Performed by: Audie Pinto, CRNA, CRNA  Patient location: Pre-op. Preanesthetic checklist: patient identified and risks and benefits discussed Lidocaine 1% used for infiltration Left, radial was placed Catheter size: 20 G Hand hygiene performed   Attempts: 2 Procedure performed without using ultrasound guided technique. Following insertion, dressing applied and Biopatch. Post procedure assessment: unchanged  Patient tolerated the procedure well with no immediate complications.

## 2023-04-24 ENCOUNTER — Encounter (HOSPITAL_COMMUNITY): Payer: Self-pay | Admitting: Neurosurgery

## 2023-04-24 LAB — GLUCOSE, CAPILLARY: Glucose-Capillary: 349 mg/dL — ABNORMAL HIGH (ref 70–99)

## 2023-04-24 NOTE — Anesthesia Postprocedure Evaluation (Signed)
Anesthesia Post Note  Patient: Micheal Duncan  Procedure(s) Performed: Right middle meningeal artery embolization     Patient location during evaluation: PACU Anesthesia Type: General Level of consciousness: awake and alert Pain management: pain level controlled Vital Signs Assessment: post-procedure vital signs reviewed and stable Respiratory status: spontaneous breathing, nonlabored ventilation, respiratory function stable and patient connected to nasal cannula oxygen Cardiovascular status: blood pressure returned to baseline and stable Postop Assessment: no apparent nausea or vomiting Anesthetic complications: no   No notable events documented.  Last Vitals:  Vitals:   04/24/23 1100 04/24/23 1137  BP: (!) 165/71 (!) 165/71  Pulse: 80   Resp: 16   Temp:    SpO2: 96%     Last Pain:  Vitals:   04/24/23 0800  TempSrc: Oral  PainSc: 0-No pain                 Collene Schlichter

## 2023-04-24 NOTE — Discharge Summary (Signed)
Physician Discharge Summary  Patient ID: Micheal Duncan MRN: [id number] DOB/AGE: 13-Nov-1946 77 y.o.  Admit date: 04/23/2023 Discharge date: 04/24/2023  Admission Diagnoses: Subdural Hematoma     Discharge Diagnoses:  Same Principal Problem:   SDH (subdural hematoma) (HCC) Active Problems:   Subdural hematoma [facility])   Discharged Condition: Stable  Hospital Course:  Micheal Duncan is a 77 y.o. male who underwent R MMA embolization for cSDH. Pt was stabilized in PACU and transferred to 4N. Hospital course was uncomplicated. Deemed stable for discharge today. F/u in clinic in 2 weeks for routine post op visit.   Treatments: R MMA embolization for cSDH  Discharge Exam: Blood pressure (!) 160/66, pulse 84, temperature (!) 97.5 F (36.4 C), temperature source Oral, resp. rate 17, height 5\' 8"  (1.727 m), weight 105.7 kg, SpO2 94 %.  Awake, alert, oriented Speech fluent, appropriate CN grossly intact 5/5 BUE/BLE Dressing c/d/i  Disposition: Discharge disposition: 01-Home or Self Care       Discharge Instructions      Remove dressing in 72 hours   Complete by: As directed       Allergies as of 04/24/2023       Reactions   Fentanyl Itching   Confusion, "skin crawling"   Duloxetine Other (See Comments)   Unknown   Gabapentin Swelling        Medication List     TAKE these medications    acetaminophen 500 MG tablet Commonly known as: TYLENOL Take 500 mg by mouth every 6 (six) hours as needed for mild pain or moderate pain.   albuterol (2.5 MG/3ML) 0.083% nebulizer solution Commonly known as: PROVENTIL Take 1 ampule by nebulization every 6 (six) hours as needed (COPD).   albuterol 108 (90 Base) MCG/ACT inhaler Commonly known as: VENTOLIN HFA Inhale 2 puffs into the lungs 4 (four) times daily as needed for wheezing or shortness of breath.   aspirin 81 MG chewable tablet Chew 1 tablet (81 mg total) by mouth daily. Start taking on: Apr 30, 2023 What changed: These instructions start on Apr 30, 2023. If you are unsure what to do until then, ask your doctor or other care provider.   atorvastatin 80 MG tablet Commonly known as: LIPITOR Take 80 mg by mouth at bedtime.   buPROPion 75 MG tablet Commonly known as: WELLBUTRIN Take 75 mg by mouth daily.   cetirizine 10 MG tablet Commonly known as: ZYRTEC Take 10 mg by mouth at bedtime.   clopidogrel 75 MG tablet Commonly known as: PLAVIX Take 1 tablet (75 mg total) by mouth daily. Hold until seen in clinic. Start taking on: Apr 30, 2023 What changed:  additional instructions These instructions start on Apr 30, 2023. If you are unsure what to do until then, ask your doctor or other care provider.   FLUoxetine 20 MG tablet Commonly known as: PROZAC Take 20 mg by mouth daily.   fluticasone 50 MCG/ACT nasal spray Commonly known as: FLONASE Place 2 sprays into both nostrils daily as needed for rhinitis or allergies.   isosorbide mononitrate 30 MG 24 hr tablet Commonly known as: IMDUR Take 30 mg by mouth daily.   LACTOBACILLUS PO Take 2 capsules by mouth every morning.   lisinopril 5 MG tablet Commonly known as: ZESTRIL Take 5 mg by mouth daily.   metFORMIN 500 MG 24 hr tablet Commonly known as: GLUCOPHAGE-XR Take 500 mg by mouth daily.   methocarbamol 500 MG tablet Commonly known as: ROBAXIN Take 500  mg by mouth in the morning and at bedtime.   metoprolol succinate 25 MG 24 hr tablet Commonly known as: TOPROL-XL Take 25 mg by mouth daily.   montelukast 10 MG tablet Commonly known as: SINGULAIR Take 10 mg by mouth every morning.   nitroGLYCERIN 0.4 MG SL tablet Commonly known as: NITROSTAT Place 0.4 mg under the tongue every 5 (five) minutes as needed for chest pain.   NovoLOG Mix 70/30 (70-30) 100 UNIT/ML injection Generic drug: insulin aspart protamine- aspart Inject 45-50 Units into the skin 2 (two) times daily.   omeprazole 20 MG capsule Commonly  known as: PRILOSEC Take 40 mg by mouth every morning.   OVER THE COUNTER MEDICATION Place 1 application  into both eyes at bedtime as needed (dry eyes). Ocuvite eye ointment 3.5   oxybutynin 5 MG tablet Commonly known as: DITROPAN Take 5 mg by mouth every morning.   prazosin 2 MG capsule Commonly known as: MINIPRESS Take 4 mg by mouth at bedtime.   Semaglutide (1 MG/DOSE) 4 MG/3ML Sopn Inject 1 mg into the skin once a week.   senna-docusate 8.6-50 MG tablet Commonly known as: Senokot-S Take 2 tablets by mouth daily.   SYSTANE BALANCE OP Place 1 drop into both eyes 4 (four) times daily as needed (Dry eyes).   thiamine 100 MG tablet Commonly known as: VITAMIN B1 Take 100 mg by mouth daily.   vitamin B-12 500 MCG tablet Commonly known as: CYANOCOBALAMIN Take 1,000 mcg by mouth daily.        Follow-up Information     [facility] Follow up.   Contact information: [address] [facility] [place] [address] [phone]                 Signed: Clovis Riley 04/24/2023, 10:13 AM

## 2023-04-24 NOTE — Progress Notes (Signed)
[facility] Transfer [facility]  [facility]) , April  called to make aware of current hospital stay and to obtain pt's PCPand SW contact information,voice message left. Gae Gallop RN,BSN,CM [phone]

## 2023-04-26 ENCOUNTER — Other Ambulatory Visit (HOSPITAL_COMMUNITY): Payer: Self-pay | Admitting: Neurosurgery

## 2023-04-26 ENCOUNTER — Encounter (HOSPITAL_COMMUNITY): Payer: Self-pay

## 2023-04-26 DIAGNOSIS — I6203 Nontraumatic chronic subdural hemorrhage: Secondary | ICD-10-CM

## 2023-05-03 ENCOUNTER — Encounter (HOSPITAL_COMMUNITY): Payer: Self-pay | Admitting: Neurosurgery

## 2023-05-03 NOTE — Addendum Note (Signed)
Addendum  created 05/03/23 0945 by Anjeanette Petzold E, MD   Intraprocedure Event edited, Intraprocedure Staff edited    

## 2023-06-08 DIAGNOSIS — J449 Chronic obstructive pulmonary disease, unspecified: Secondary | ICD-10-CM | POA: Diagnosis not present

## 2023-06-08 DIAGNOSIS — J0100 Acute maxillary sinusitis, unspecified: Secondary | ICD-10-CM | POA: Diagnosis not present

## 2023-08-29 ENCOUNTER — Emergency Department (HOSPITAL_COMMUNITY): Payer: No Typology Code available for payment source

## 2023-08-29 ENCOUNTER — Emergency Department (HOSPITAL_COMMUNITY)
Admission: EM | Admit: 2023-08-29 | Discharge: 2023-08-29 | Disposition: A | Discharge status: Home / Self Care | Payer: No Typology Code available for payment source

## 2023-08-29 ENCOUNTER — Other Ambulatory Visit: Payer: Self-pay

## 2023-08-29 DIAGNOSIS — N179 Acute kidney failure, unspecified: Secondary | ICD-10-CM | POA: Insufficient documentation

## 2023-08-29 DIAGNOSIS — Z7984 Long term (current) use of oral hypoglycemic drugs: Secondary | ICD-10-CM | POA: Insufficient documentation

## 2023-08-29 DIAGNOSIS — I10 Essential (primary) hypertension: Secondary | ICD-10-CM | POA: Insufficient documentation

## 2023-08-29 DIAGNOSIS — R55 Syncope and collapse: Secondary | ICD-10-CM | POA: Insufficient documentation

## 2023-08-29 DIAGNOSIS — W19XXXA Unspecified fall, initial encounter: Secondary | ICD-10-CM | POA: Diagnosis not present

## 2023-08-29 DIAGNOSIS — Z794 Long term (current) use of insulin: Secondary | ICD-10-CM | POA: Insufficient documentation

## 2023-08-29 DIAGNOSIS — I2510 Atherosclerotic heart disease of native coronary artery without angina pectoris: Secondary | ICD-10-CM | POA: Insufficient documentation

## 2023-08-29 DIAGNOSIS — Z79899 Other long term (current) drug therapy: Secondary | ICD-10-CM | POA: Diagnosis not present

## 2023-08-29 DIAGNOSIS — R42 Dizziness and giddiness: Secondary | ICD-10-CM | POA: Diagnosis not present

## 2023-08-29 DIAGNOSIS — Z7982 Long term (current) use of aspirin: Secondary | ICD-10-CM | POA: Insufficient documentation

## 2023-08-29 DIAGNOSIS — E119 Type 2 diabetes mellitus without complications: Secondary | ICD-10-CM | POA: Insufficient documentation

## 2023-08-29 DIAGNOSIS — R110 Nausea: Secondary | ICD-10-CM | POA: Diagnosis present

## 2023-08-29 LAB — TROPONIN I (HIGH SENSITIVITY)
Troponin I (High Sensitivity): 8 ng/L (ref <18)
Troponin I (High Sensitivity): 8 ng/L (ref <18)

## 2023-08-29 LAB — BASIC METABOLIC PANEL
Anion gap: 10 (ref 5–15)
BUN: 25 mg/dL — ABNORMAL HIGH (ref 8–23)
CO2: 23 mmol/L (ref 22–32)
Calcium: 8.7 mg/dL — ABNORMAL LOW (ref 8.9–10.3)
Chloride: 103 mmol/L (ref 98–111)
Creatinine, Ser: 1.67 mg/dL — ABNORMAL HIGH (ref 0.61–1.24)
GFR, Estimated: 43 mL/min — ABNORMAL LOW (ref >60)
Glucose, Bld: 181 mg/dL — ABNORMAL HIGH (ref 70–99)
Potassium: 3.5 mmol/L (ref 3.5–5.1)
Sodium: 136 mmol/L (ref 135–145)

## 2023-08-29 LAB — CBC
HCT: 41 % (ref 39.0–52.0)
Hemoglobin: 13.5 g/dL (ref 13.0–17.0)
MCH: 28.4 pg (ref 26.0–34.0)
MCHC: 32.9 g/dL (ref 30.0–36.0)
MCV: 86.3 fL (ref 80.0–100.0)
Platelets: 181 10*3/uL (ref 150–400)
RBC: 4.75 MIL/uL (ref 4.22–5.81)
RDW: 13.3 % (ref 11.5–15.5)
WBC: 5.8 10*3/uL (ref 4.0–10.5)
nRBC: 0 % (ref 0.0–0.2)

## 2023-08-29 MED ORDER — SODIUM CHLORIDE 0.9 % IV BOLUS
1000.0000 mL | Freq: Once | INTRAVENOUS | Status: AC
  Administered 2023-08-29: 1000 mL via INTRAVENOUS

## 2023-08-29 NOTE — Progress Notes (Signed)
   08/29/23 1000  Spiritual Encounters  Type of Visit Initial  Care provided to: Patient;Family  Referral source Trauma page  Reason for visit Trauma  OnCall Visit No   Ch responded to trauma II page. Pt's family was at bedside. Ch provided hospitality and compassionate presence. No follow-up needed at this time.    Chaplain Val Antowan Samford, M.Div.

## 2023-08-29 NOTE — Discharge Instructions (Addendum)
Your workup.  Does appear to show some dehydration.  Please drink plenty of fluids.  Please call your doctor to schedule a follow-up appointment and see if they would like to do any further workup as an outpatient.  Your CT scans here today were stable.  Return to the emergency department for worsening symptoms.

## 2023-08-29 NOTE — ED Notes (Signed)
Patient transported to CT 

## 2023-08-29 NOTE — ED Triage Notes (Signed)
Pt arrived to ed for the c/o fall last night, pt passed out, hit his head, on plavix. Hx hematoma on brain, brain surgery within the past year. Pt is alert and oriented x4

## 2023-08-29 NOTE — ED Provider Notes (Signed)
[place] EMERGENCY DEPARTMENT AT [facility] Provider Note   CSN: [id number] Arrival date & time: 08/29/23  6295     History  Chief Complaint  Patient presents with   Fall   Loss of Consciousness    Micheal Duncan is a 77 y.o. male.  77 year old male with past medical history of coronary artery disease on Plavix, diabetes, and hypertension presenting to the emergency department today after a fall on blood thinners.  The patient states that last night that he became very lightheaded and nauseous.  He remembers waking up on the floor but did lose consciousness.  He states he was having a headache last night but this had improved.  He denies any associated chest pain.  He got up this morning he was talked to his family and they brought him into the emergency department for further evaluation.  He has had some falls in the past and has had issues with intracranial bleeding secondary to these falls.  States that he is feeling little better this morning.  The history is provided by the patient.  Fall  Loss of Consciousness Associated symptoms: nausea        Home Medications Prior to Admission medications   Medication Sig Start Date End Date Taking? Authorizing Provider  acetaminophen (TYLENOL) 500 MG tablet Take 500 mg by mouth every 6 (six) hours as needed for mild pain or moderate pain.   Yes [provider]  albuterol (PROVENTIL HFA;VENTOLIN HFA) 108 (90 BASE) MCG/ACT inhaler Inhale 2 puffs into the lungs 4 (four) times daily as needed for wheezing or shortness of breath.   Yes [provider]  albuterol (PROVENTIL) (2.5 MG/3ML) 0.083% nebulizer solution Take 1 ampule by nebulization every 6 (six) hours as needed (COPD).   Yes [provider]  aspirin 81 MG chewable tablet Chew 1 tablet (81 mg total) by mouth daily. 04/30/23  Yes Patrici Ranks Caylin, PA-C  atorvastatin (LIPITOR) 80 MG tablet Take 80 mg by mouth at bedtime.   Yes [provider]  buPROPion (WELLBUTRIN) 75 MG tablet Take 75 mg by mouth daily. 05/08/22  Yes [provider]  cetirizine (ZYRTEC) 10 MG tablet Take 10 mg by mouth at bedtime.   Yes [provider]  clopidogrel (PLAVIX) 75 MG tablet Take 75 mg by mouth daily. 04/30/23  Yes Patrici Ranks Caylin, PA-C  FLUoxetine (PROZAC) 20 MG tablet Take 20 mg by mouth daily.   Yes [provider]  fluticasone (FLONASE) 50 MCG/ACT nasal spray Place 2 sprays into both nostrils daily as needed for rhinitis or allergies.   Yes [provider]  furosemide (LASIX) 40 MG tablet Take 40 mg by mouth daily. 06/03/23  Yes [provider]  insulin aspart protamine- aspart (NOVOLOG MIX 70/30) (70-30) 100 UNIT/ML injection Inject 45-50 Units into the skin 2 (two) times daily. 09/12/21  Yes [provider]  isosorbide mononitrate (IMDUR) 30 MG 24 hr tablet Take 30 mg by mouth daily.   Yes [provider]  LACTOBACILLUS PO Take 2 capsules by mouth every morning.   Yes [provider]  lisinopril (ZESTRIL) 5 MG tablet Take 10 mg by mouth daily.   Yes [provider]  metFORMIN (GLUCOPHAGE-XR) 500 MG 24 hr tablet Take 500 mg by mouth daily. 03/06/22  Yes [provider]  methocarbamol (ROBAXIN) 500 MG tablet Take 500 mg by mouth in the morning and at bedtime.   Yes [provider]  metoprolol succinate (TOPROL-XL)  25 MG 24 hr tablet Take 25 mg by mouth daily. 06/16/21  Yes [provider]  montelukast (SINGULAIR) 10 MG tablet Take 10 mg by mouth every morning.   Yes [provider]  nitroGLYCERIN (NITROSTAT) 0.4 MG SL tablet Place 0.4 mg under the tongue every 5 (five) minutes as needed for chest pain.   Yes [provider]  omeprazole (PRILOSEC) 20 MG capsule Take 40 mg by mouth every morning.   Yes [provider]  OVER THE COUNTER MEDICATION Place 1 application  into both eyes at bedtime as needed (dry  eyes). Ocuvite eye ointment 3.5   Yes [provider]  oxybutynin (DITROPAN) 5 MG tablet Take 5 mg by mouth every morning.   Yes [provider]  prazosin (MINIPRESS) 2 MG capsule Take 4 mg by mouth at bedtime.   Yes [provider]  Propylene Glycol (SYSTANE BALANCE OP) Place 1 drop into both eyes 4 (four) times daily as needed (Dry eyes).   Yes [provider]  Semaglutide, 1 MG/DOSE, 4 MG/3ML SOPN Inject 1 mg into the skin once a week. On Wednesday 09/12/21  Yes [provider]  senna-docusate (SENOKOT-S) 8.6-50 MG tablet Take 2 tablets by mouth daily.   Yes [provider]  thiamine 100 MG tablet Take 100 mg by mouth daily.   Yes [provider]  vitamin B-12 (CYANOCOBALAMIN) 500 MCG tablet Take 1,000 mcg by mouth daily.   Yes [provider]      Allergies    Fentanyl, Duloxetine, and Gabapentin    Review of Systems   Review of Systems  Cardiovascular:  Positive for syncope.  Gastrointestinal:  Positive for nausea.  Neurological:  Positive for light-headedness.  All other systems reviewed and are negative.   Physical Exam Updated Vital Signs BP 127/71   Pulse 69   Temp 97.9 F (36.6 C) (Oral)   Resp 13   Ht 5\' 8"  (1.727 m)   Wt 106.1 kg   SpO2 97%   BMI 35.58 kg/m  Physical Exam Vitals and nursing note reviewed.   Gen: NAD Eyes: PERRL, EOMI HEENT: no oropharyngeal swelling Neck: trachea midline, the patient is tender over the mid cervical spine with no step-offs or deformities, no stepoffs or deformities Resp: clear to auscultation bilaterally Card: RRR, no murmurs, rubs, or gallops Abd: nontender, nondistended, no seatbelt sign Extremities: no calf tenderness, no edema MSK: no thoracic spinal tenderness, no lumbar spinal tenderness, no step-offs or deformities Vascular: 2+ radial pulses bilaterally, 2+ DP pulses bilaterally Neuro: Alert and oriented x 3, equal strength sensation throughout  bilateral upper and lower extremities Skin: no rashes   ED Results / Procedures / Treatments   Labs (all labs ordered are listed, but only abnormal results are displayed) Labs Reviewed  BASIC METABOLIC PANEL - Abnormal; Notable for the following components:      Result Value   Glucose, Bld 181 (*)    BUN 25 (*)    Creatinine, Ser 1.67 (*)    Calcium 8.7 (*)    GFR, Estimated 43 (*)    All other components within normal limits  CBC  TROPONIN I (HIGH SENSITIVITY)  TROPONIN I (HIGH SENSITIVITY)    EKG EKG Interpretation Date/Time:  Thursday August 29 2023 09:13:37 EDT Ventricular Rate:  72 PR Interval:  163 QRS Duration:  102 QT Interval:  419 QTC Calculation: 459 R Axis:   69  Text Interpretation: Sinus rhythm Nonspecific T abnrm, anterolateral leads Confirmed by  Beckey Downing ([id number]) on 08/29/2023 9:15:01 AM  Radiology CT Cervical Spine Wo Contrast  Result Date: 08/29/2023 CLINICAL DATA:  Neck trauma.  Fall.  Loss of consciousness. EXAM: CT CERVICAL SPINE WITHOUT CONTRAST TECHNIQUE: Multidetector CT imaging of the cervical spine was performed without intravenous contrast. Multiplanar CT image reconstructions were also generated. RADIATION DOSE REDUCTION: This exam was performed according to the departmental dose-optimization program which includes automated exposure control, adjustment of the mA and/or kV according to patient size and/or use of iterative reconstruction technique. COMPARISON:  MR of the cervical spine without contrast at [facility] 01/26/2004. FINDINGS: Alignment: No significant listhesis is present. Straightening of the normal cervical lordosis is present. Skull base and vertebrae: Craniocervical junction is normal. Vertebral body heights and alignment are normal. Straightening of the normal cervical lordosis is present. Soft tissues and spinal canal: No prevertebral fluid or swelling. No visible canal hematoma. Disc levels: A broad-based disc osteophyte  complex is present at C5-6 and C6-7. Asymmetric left-sided facet hypertrophy contributes to severe left foraminal stenosis at C5-6. Upper chest: The lung apices are clear. The thoracic inlet is within normal limits. IMPRESSION: 1. No acute fracture or traumatic subluxation. 2. Multilevel degenerative changes of the cervical spine as described. 3. 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 Electronically Signed   By: Marin Roberts M.D.   On: 08/29/2023 10:13   CT Head Wo Contrast  Result Date: 08/29/2023 CLINICAL DATA:  Head trauma.  Fall.  Loss of conscious. EXAM: CT HEAD WITHOUT CONTRAST TECHNIQUE: Contiguous axial images were obtained from the base of the skull through the vertex without intravenous contrast. RADIATION DOSE REDUCTION: This exam was performed according to the departmental dose-optimization program which includes automated exposure control, adjustment of the mA and/or kV according to patient size and/or use of iterative reconstruction technique. COMPARISON:  CT head without contrast 07/01/2023 and 05/27/2023 at [facility] imaging. FINDINGS: Brain: A right extra-axial collection is stable compared to the prior exams. It measures 1.6 cm maximally on coronal imaging over the left parietal convexity. There is of higher density inferiorly and posteriorly are stable. No new hemorrhage is present. Midline shift is stable, measuring 2-3 mm. A right frontal ventriculostomy catheter is in place. Ventricles are stable in size. No acute cortical infarct is present. Deep brain nuclei are within normal limits. Mild subcortical white matter hypoattenuation bilaterally is stable. The brainstem and cerebellum are within normal limits. Midline structures are within normal limits. Vascular: Minimal atherosclerotic calcifications within the cavernous internal carotid arteries bilaterally are stable. No hyperdense vessel is present. Skull: No significant extracranial soft tissue lesion is present. Calvarium is  intact apart from the burr hole for the ventriculostomy catheter. Programmable shunt is noted. Ventriculostomy tubing is intact. Sinuses/Orbits: The paranasal sinuses and mastoid air cells are clear. The globes and orbits are within normal limits. IMPRESSION: 1. Stable right extra-axial subdural hematoma. 2. Stable 2-3 mm of midline shift. 3. Stable right frontal ventriculostomy catheter and programmable shunt. 4. Stable mild diffuse white matter disease. This likely reflects the sequela of chronic microvascular ischemia. 5. No acute intracranial abnormality or significant interval change. Electronically Signed   By: Marin Roberts M.D.   On: 08/29/2023 10:10    Procedures Procedures    Medications Ordered in ED Medications  sodium chloride 0.9 % bolus 1,000 mL (1,000 mLs Intravenous New Bag/Given 08/29/23 1049)    ED Course/ Medical Decision Making/ A&P  Medical Decision Making 77 year old male with past medical history of diabetes and coronary artery disease presenting to the emergency department today with headache that is since resolved after a syncopal episode last night.  I will further evaluate patient here with a cardiac workup to evaluate for ACS or conduction abnormalities.  I will keep the patient on the cardiac monitor to evaluate for arrhythmias.  Obtain a CT scan of patient's head and cervical spine for further evaluation for acute traumatic injuries.  I will reevaluate for ultimate disposition.  He does not have any significant thoracic or lumbar spinal tenderness and is denying any chest pain or hip pain so I do not think that he needs chest x-ray or pelvis x-ray at this time.  The patient's EKG interpreted by me shows a sinus rhythm with a rate of 72 with normal axis, normal intervals, nonspecific ST-T changes including some T wave inversions in the lateral leads.  This does appear similar in morphology to his previous EKG.  The patient was  found to have an acute kidney injury.  His EKG and troponin were negative.  Patient is given IV fluids.  He is feeling much better.  We did discuss admission for further evaluation given his age.  The patient states that he has been worked up before and this has happened multiple times in the past.  After a long discussion with the patient and family he is ultimately discharged through shared decision making we will follow-up with his outpatient providers.  He was given IV fluids here for the acute kidney injury.  Amount and/or Complexity of Data Reviewed Labs: ordered. Radiology: ordered.           Final Clinical Impression(s) / ED Diagnoses Final diagnoses:  AKI (acute kidney injury) (HCC)  Fall, initial encounter    Rx / DC Orders ED Discharge Orders     None         Durwin Glaze, MD 08/29/23 1149

## 2024-06-24 ENCOUNTER — Other Ambulatory Visit (HOSPITAL_COMMUNITY): Payer: Self-pay | Admitting: Infectious Diseases

## 2024-06-24 DIAGNOSIS — Z87891 Personal history of nicotine dependence: Secondary | ICD-10-CM

## 2024-07-02 ENCOUNTER — Ambulatory Visit (INDEPENDENT_AMBULATORY_CARE_PROVIDER_SITE_OTHER)
Admission: RE | Admit: 2024-07-02 | Discharge: 2024-07-02 | Disposition: A | Discharge status: Home / Self Care | Source: Ambulatory Visit | Attending: Infectious Diseases | Admitting: Infectious Diseases

## 2024-07-02 DIAGNOSIS — Z87891 Personal history of nicotine dependence: Secondary | ICD-10-CM | POA: Diagnosis not present

## 2024-08-09 DIAGNOSIS — M25421 Effusion, right elbow: Secondary | ICD-10-CM | POA: Diagnosis not present

## 2024-08-09 DIAGNOSIS — L03113 Cellulitis of right upper limb: Secondary | ICD-10-CM | POA: Diagnosis not present

## 2024-08-09 DIAGNOSIS — M25521 Pain in right elbow: Secondary | ICD-10-CM | POA: Diagnosis not present

## 2024-08-24 ENCOUNTER — Emergency Department (HOSPITAL_COMMUNITY)

## 2024-08-24 ENCOUNTER — Other Ambulatory Visit: Payer: Self-pay

## 2024-08-24 ENCOUNTER — Inpatient Hospital Stay (HOSPITAL_COMMUNITY)
Admission: EM | Admit: 2024-08-24 | Discharge: 2024-08-28 | DRG: 641 | Disposition: A | Discharge status: Home / Self Care | Attending: Family Medicine | Admitting: Family Medicine

## 2024-08-24 ENCOUNTER — Encounter (HOSPITAL_COMMUNITY): Payer: Self-pay

## 2024-08-24 DIAGNOSIS — Z794 Long term (current) use of insulin: Secondary | ICD-10-CM

## 2024-08-24 DIAGNOSIS — M109 Gout, unspecified: Secondary | ICD-10-CM | POA: Insufficient documentation

## 2024-08-24 DIAGNOSIS — Z87891 Personal history of nicotine dependence: Secondary | ICD-10-CM

## 2024-08-24 DIAGNOSIS — J449 Chronic obstructive pulmonary disease, unspecified: Secondary | ICD-10-CM | POA: Diagnosis present

## 2024-08-24 DIAGNOSIS — R531 Weakness: Secondary | ICD-10-CM | POA: Diagnosis present

## 2024-08-24 DIAGNOSIS — Z955 Presence of coronary angioplasty implant and graft: Secondary | ICD-10-CM

## 2024-08-24 DIAGNOSIS — G4733 Obstructive sleep apnea (adult) (pediatric): Secondary | ICD-10-CM | POA: Diagnosis present

## 2024-08-24 DIAGNOSIS — Z79899 Other long term (current) drug therapy: Secondary | ICD-10-CM

## 2024-08-24 DIAGNOSIS — Z982 Presence of cerebrospinal fluid drainage device: Secondary | ICD-10-CM

## 2024-08-24 DIAGNOSIS — I252 Old myocardial infarction: Secondary | ICD-10-CM

## 2024-08-24 DIAGNOSIS — M48061 Spinal stenosis, lumbar region without neurogenic claudication: Secondary | ICD-10-CM | POA: Diagnosis present

## 2024-08-24 DIAGNOSIS — E1142 Type 2 diabetes mellitus with diabetic polyneuropathy: Secondary | ICD-10-CM | POA: Diagnosis present

## 2024-08-24 DIAGNOSIS — R29898 Other symptoms and signs involving the musculoskeletal system: Secondary | ICD-10-CM | POA: Diagnosis present

## 2024-08-24 DIAGNOSIS — E538 Deficiency of other specified B group vitamins: Secondary | ICD-10-CM | POA: Diagnosis not present

## 2024-08-24 DIAGNOSIS — Z7982 Long term (current) use of aspirin: Secondary | ICD-10-CM

## 2024-08-24 DIAGNOSIS — I2510 Atherosclerotic heart disease of native coronary artery without angina pectoris: Secondary | ICD-10-CM | POA: Diagnosis present

## 2024-08-24 DIAGNOSIS — Z789 Other specified health status: Secondary | ICD-10-CM

## 2024-08-24 DIAGNOSIS — R262 Difficulty in walking, not elsewhere classified: Secondary | ICD-10-CM

## 2024-08-24 DIAGNOSIS — N1832 Chronic kidney disease, stage 3b: Secondary | ICD-10-CM | POA: Diagnosis present

## 2024-08-24 DIAGNOSIS — Z833 Family history of diabetes mellitus: Secondary | ICD-10-CM

## 2024-08-24 DIAGNOSIS — Z888 Allergy status to other drugs, medicaments and biological substances status: Secondary | ICD-10-CM

## 2024-08-24 DIAGNOSIS — M7021 Olecranon bursitis, right elbow: Secondary | ICD-10-CM | POA: Diagnosis present

## 2024-08-24 DIAGNOSIS — S065XAS Traumatic subdural hemorrhage with loss of consciousness status unknown, sequela: Secondary | ICD-10-CM

## 2024-08-24 DIAGNOSIS — R112 Nausea with vomiting, unspecified: Principal | ICD-10-CM

## 2024-08-24 DIAGNOSIS — M4804 Spinal stenosis, thoracic region: Secondary | ICD-10-CM | POA: Diagnosis present

## 2024-08-24 DIAGNOSIS — I129 Hypertensive chronic kidney disease with stage 1 through stage 4 chronic kidney disease, or unspecified chronic kidney disease: Secondary | ICD-10-CM | POA: Diagnosis present

## 2024-08-24 DIAGNOSIS — M25571 Pain in right ankle and joints of right foot: Secondary | ICD-10-CM | POA: Diagnosis present

## 2024-08-24 DIAGNOSIS — Z8673 Personal history of transient ischemic attack (TIA), and cerebral infarction without residual deficits: Secondary | ICD-10-CM

## 2024-08-24 DIAGNOSIS — G8929 Other chronic pain: Secondary | ICD-10-CM | POA: Diagnosis present

## 2024-08-24 DIAGNOSIS — Z1152 Encounter for screening for COVID-19: Secondary | ICD-10-CM

## 2024-08-24 DIAGNOSIS — E1122 Type 2 diabetes mellitus with diabetic chronic kidney disease: Secondary | ICD-10-CM | POA: Diagnosis present

## 2024-08-24 DIAGNOSIS — M1000 Idiopathic gout, unspecified site: Secondary | ICD-10-CM | POA: Diagnosis present

## 2024-08-24 DIAGNOSIS — F1491 Cocaine use, unspecified, in remission: Secondary | ICD-10-CM | POA: Diagnosis present

## 2024-08-24 DIAGNOSIS — F4310 Post-traumatic stress disorder, unspecified: Secondary | ICD-10-CM | POA: Diagnosis present

## 2024-08-24 DIAGNOSIS — Z85828 Personal history of other malignant neoplasm of skin: Secondary | ICD-10-CM

## 2024-08-24 DIAGNOSIS — F1091 Alcohol use, unspecified, in remission: Secondary | ICD-10-CM | POA: Diagnosis present

## 2024-08-24 DIAGNOSIS — Z951 Presence of aortocoronary bypass graft: Secondary | ICD-10-CM

## 2024-08-24 DIAGNOSIS — Z7985 Long-term (current) use of injectable non-insulin antidiabetic drugs: Secondary | ICD-10-CM

## 2024-08-24 DIAGNOSIS — Z7902 Long term (current) use of antithrombotics/antiplatelets: Secondary | ICD-10-CM

## 2024-08-24 DIAGNOSIS — G63 Polyneuropathy in diseases classified elsewhere: Secondary | ICD-10-CM | POA: Diagnosis present

## 2024-08-24 DIAGNOSIS — E7800 Pure hypercholesterolemia, unspecified: Secondary | ICD-10-CM | POA: Diagnosis present

## 2024-08-24 DIAGNOSIS — M7031 Other bursitis of elbow, right elbow: Secondary | ICD-10-CM | POA: Insufficient documentation

## 2024-08-24 DIAGNOSIS — Z7984 Long term (current) use of oral hypoglycemic drugs: Secondary | ICD-10-CM

## 2024-08-24 DIAGNOSIS — G912 (Idiopathic) normal pressure hydrocephalus: Secondary | ICD-10-CM | POA: Diagnosis present

## 2024-08-24 DIAGNOSIS — R519 Headache, unspecified: Secondary | ICD-10-CM | POA: Diagnosis present

## 2024-08-24 DIAGNOSIS — F32A Depression, unspecified: Secondary | ICD-10-CM | POA: Diagnosis present

## 2024-08-24 DIAGNOSIS — R509 Fever, unspecified: Secondary | ICD-10-CM | POA: Insufficient documentation

## 2024-08-24 DIAGNOSIS — M25572 Pain in left ankle and joints of left foot: Secondary | ICD-10-CM | POA: Diagnosis present

## 2024-08-24 DIAGNOSIS — R32 Unspecified urinary incontinence: Secondary | ICD-10-CM | POA: Diagnosis present

## 2024-08-24 DIAGNOSIS — N179 Acute kidney failure, unspecified: Secondary | ICD-10-CM | POA: Diagnosis present

## 2024-08-24 DIAGNOSIS — K219 Gastro-esophageal reflux disease without esophagitis: Secondary | ICD-10-CM | POA: Diagnosis present

## 2024-08-24 DIAGNOSIS — E119 Type 2 diabetes mellitus without complications: Secondary | ICD-10-CM

## 2024-08-24 LAB — CBC WITH DIFFERENTIAL/PLATELET
Abs Immature Granulocytes: 0.04 K/uL (ref 0.00–0.07)
Basophils Absolute: 0.1 K/uL (ref 0.0–0.1)
Basophils Relative: 1 %
Eosinophils Absolute: 0 K/uL (ref 0.0–0.5)
Eosinophils Relative: 0 %
HCT: 43.4 % (ref 39.0–52.0)
Hemoglobin: 13.9 g/dL (ref 13.0–17.0)
Immature Granulocytes: 1 %
Lymphocytes Relative: 7 %
Lymphs Abs: 0.5 K/uL — ABNORMAL LOW (ref 0.7–4.0)
MCH: 29 pg (ref 26.0–34.0)
MCHC: 32 g/dL (ref 30.0–36.0)
MCV: 90.6 fL (ref 80.0–100.0)
Monocytes Absolute: 0.9 K/uL (ref 0.1–1.0)
Monocytes Relative: 11 %
Neutro Abs: 6.5 K/uL (ref 1.7–7.7)
Neutrophils Relative %: 80 %
Platelets: 206 K/uL (ref 150–400)
RBC: 4.79 MIL/uL (ref 4.22–5.81)
RDW: 13 % (ref 11.5–15.5)
WBC: 8 K/uL (ref 4.0–10.5)
nRBC: 0 % (ref 0.0–0.2)

## 2024-08-24 LAB — COMPREHENSIVE METABOLIC PANEL WITH GFR
ALT: 36 U/L (ref 0–44)
AST: 22 U/L (ref 15–41)
Albumin: 3.4 g/dL — ABNORMAL LOW (ref 3.5–5.0)
Alkaline Phosphatase: 91 U/L (ref 38–126)
Anion gap: 13 (ref 5–15)
BUN: 21 mg/dL (ref 8–23)
CO2: 17 mmol/L — ABNORMAL LOW (ref 22–32)
Calcium: 8.6 mg/dL — ABNORMAL LOW (ref 8.9–10.3)
Chloride: 99 mmol/L (ref 98–111)
Creatinine, Ser: 1.93 mg/dL — ABNORMAL HIGH (ref 0.61–1.24)
GFR, Estimated: 36 mL/min — ABNORMAL LOW (ref >60)
Glucose, Bld: 292 mg/dL — ABNORMAL HIGH (ref 70–99)
Potassium: 5.5 mmol/L — ABNORMAL HIGH (ref 3.5–5.1)
Sodium: 129 mmol/L — ABNORMAL LOW (ref 135–145)
Total Bilirubin: 0.9 mg/dL (ref 0.0–1.2)
Total Protein: 6.7 g/dL (ref 6.5–8.1)

## 2024-08-24 LAB — RESP PANEL BY RT-PCR (RSV, FLU A&B, COVID)  RVPGX2
Influenza A by PCR: NEGATIVE
Influenza B by PCR: NEGATIVE
Resp Syncytial Virus by PCR: NEGATIVE
SARS Coronavirus 2 by RT PCR: NEGATIVE

## 2024-08-24 NOTE — ED Provider Triage Note (Signed)
 Emergency Medicine Provider Triage Evaluation Note  Micheal Duncan , a 78 y.o. male  was evaluated in triage.  Pt complains of headache, nausea, vomiting.  Patient reports that he has been experiencing generalized weakness, headache, nausea, vomiting, chills, bilateral leg pain that started over the last several days.  He did have an episode of incontinence as well.  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  Denies any issues or complications since the shunt was placed about 1-1/2 years ago.  States that he does not routinely get headaches so he is unsure if this headache is related to any issues with his shunt.  Patient's daughter does report some concerns that patient is possibly slightly confused although she is unsure if he may just be tired as he is up well past his typical bedtime.  Review of Systems  Positive: As above Negative: As above  Physical Exam  BP (!) 152/74 (BP Location: Right Arm)   Pulse 100   Temp 98.4 F (36.9 C)   Resp 20   Ht 5' 8 (1.727 m)   Wt 104.3 kg   SpO2 94%   BMI 34.97 kg/m  Gen:   Awake, no distress   Resp:  Normal effort  MSK:   Moves extremities without difficulty  Other:    Medical Decision Making  Medically screening exam initiated at 9:47 PM.  Appropriate orders placed.  Micheal Duncan was informed that the remainder of the evaluation will be completed by another provider, this initial triage assessment does not replace that evaluation, and the importance of remaining in the ED until their evaluation is complete.     Micheal Duncan A, PA-C 08/24/24 2148

## 2024-08-24 NOTE — ED Triage Notes (Addendum)
 Pt with a hx of VP shunt placement presents with generalized weakness, HA, N/V, chills, and bilateral leg pain that started yesterday. He did have one episode of incontinence. Denies sick contacts.

## 2024-08-24 NOTE — ED Triage Notes (Signed)
 Reports headache with nausea and vomiting. Patient threw up in the parking lot. Daughter states today he called her at  and stated yesterday he lost the ability to stand. Patient is also incontient and has been unable to hold anything down. Patient has a shunt for hydrocephalus.

## 2024-08-25 ENCOUNTER — Inpatient Hospital Stay (HOSPITAL_COMMUNITY)

## 2024-08-25 ENCOUNTER — Emergency Department (HOSPITAL_COMMUNITY)

## 2024-08-25 ENCOUNTER — Encounter (HOSPITAL_COMMUNITY): Payer: Self-pay

## 2024-08-25 DIAGNOSIS — E1142 Type 2 diabetes mellitus with diabetic polyneuropathy: Secondary | ICD-10-CM | POA: Diagnosis present

## 2024-08-25 DIAGNOSIS — K219 Gastro-esophageal reflux disease without esophagitis: Secondary | ICD-10-CM | POA: Diagnosis present

## 2024-08-25 DIAGNOSIS — N1832 Chronic kidney disease, stage 3b: Secondary | ICD-10-CM | POA: Diagnosis present

## 2024-08-25 DIAGNOSIS — E7800 Pure hypercholesterolemia, unspecified: Secondary | ICD-10-CM | POA: Diagnosis present

## 2024-08-25 DIAGNOSIS — Z833 Family history of diabetes mellitus: Secondary | ICD-10-CM | POA: Diagnosis not present

## 2024-08-25 DIAGNOSIS — T859XXA Unspecified complication of internal prosthetic device, implant and graft, initial encounter: Secondary | ICD-10-CM | POA: Diagnosis not present

## 2024-08-25 DIAGNOSIS — E1122 Type 2 diabetes mellitus with diabetic chronic kidney disease: Secondary | ICD-10-CM | POA: Diagnosis present

## 2024-08-25 DIAGNOSIS — F32A Depression, unspecified: Secondary | ICD-10-CM | POA: Diagnosis present

## 2024-08-25 DIAGNOSIS — M109 Gout, unspecified: Secondary | ICD-10-CM | POA: Diagnosis not present

## 2024-08-25 DIAGNOSIS — E119 Type 2 diabetes mellitus without complications: Secondary | ICD-10-CM

## 2024-08-25 DIAGNOSIS — G912 (Idiopathic) normal pressure hydrocephalus: Secondary | ICD-10-CM | POA: Diagnosis not present

## 2024-08-25 DIAGNOSIS — Z794 Long term (current) use of insulin: Secondary | ICD-10-CM | POA: Diagnosis not present

## 2024-08-25 DIAGNOSIS — M48061 Spinal stenosis, lumbar region without neurogenic claudication: Secondary | ICD-10-CM

## 2024-08-25 DIAGNOSIS — M25821 Other specified joint disorders, right elbow: Secondary | ICD-10-CM | POA: Diagnosis not present

## 2024-08-25 DIAGNOSIS — Z85828 Personal history of other malignant neoplasm of skin: Secondary | ICD-10-CM | POA: Diagnosis not present

## 2024-08-25 DIAGNOSIS — Z87891 Personal history of nicotine dependence: Secondary | ICD-10-CM | POA: Diagnosis not present

## 2024-08-25 DIAGNOSIS — Z7984 Long term (current) use of oral hypoglycemic drugs: Secondary | ICD-10-CM | POA: Diagnosis not present

## 2024-08-25 DIAGNOSIS — N179 Acute kidney failure, unspecified: Secondary | ICD-10-CM | POA: Insufficient documentation

## 2024-08-25 DIAGNOSIS — Z7985 Long-term (current) use of injectable non-insulin antidiabetic drugs: Secondary | ICD-10-CM | POA: Diagnosis not present

## 2024-08-25 DIAGNOSIS — R112 Nausea with vomiting, unspecified: Secondary | ICD-10-CM

## 2024-08-25 DIAGNOSIS — M1990 Unspecified osteoarthritis, unspecified site: Secondary | ICD-10-CM

## 2024-08-25 DIAGNOSIS — M7021 Olecranon bursitis, right elbow: Secondary | ICD-10-CM | POA: Diagnosis not present

## 2024-08-25 DIAGNOSIS — Z789 Other specified health status: Secondary | ICD-10-CM

## 2024-08-25 DIAGNOSIS — I129 Hypertensive chronic kidney disease with stage 1 through stage 4 chronic kidney disease, or unspecified chronic kidney disease: Secondary | ICD-10-CM | POA: Diagnosis present

## 2024-08-25 DIAGNOSIS — R29898 Other symptoms and signs involving the musculoskeletal system: Secondary | ICD-10-CM | POA: Diagnosis not present

## 2024-08-25 DIAGNOSIS — Z951 Presence of aortocoronary bypass graft: Secondary | ICD-10-CM | POA: Diagnosis not present

## 2024-08-25 DIAGNOSIS — G63 Polyneuropathy in diseases classified elsewhere: Secondary | ICD-10-CM | POA: Diagnosis not present

## 2024-08-25 DIAGNOSIS — Z1152 Encounter for screening for COVID-19: Secondary | ICD-10-CM | POA: Diagnosis not present

## 2024-08-25 DIAGNOSIS — Z79899 Other long term (current) drug therapy: Secondary | ICD-10-CM | POA: Diagnosis not present

## 2024-08-25 DIAGNOSIS — E538 Deficiency of other specified B group vitamins: Secondary | ICD-10-CM | POA: Diagnosis not present

## 2024-08-25 DIAGNOSIS — Z888 Allergy status to other drugs, medicaments and biological substances status: Secondary | ICD-10-CM | POA: Diagnosis not present

## 2024-08-25 DIAGNOSIS — M4804 Spinal stenosis, thoracic region: Secondary | ICD-10-CM | POA: Diagnosis present

## 2024-08-25 DIAGNOSIS — I2510 Atherosclerotic heart disease of native coronary artery without angina pectoris: Secondary | ICD-10-CM | POA: Diagnosis present

## 2024-08-25 DIAGNOSIS — J449 Chronic obstructive pulmonary disease, unspecified: Secondary | ICD-10-CM | POA: Diagnosis present

## 2024-08-25 DIAGNOSIS — M10021 Idiopathic gout, right elbow: Secondary | ICD-10-CM | POA: Diagnosis not present

## 2024-08-25 LAB — I-STAT CHEM 8, ED
BUN: 30 mg/dL — ABNORMAL HIGH (ref 8–23)
Calcium, Ion: 1.15 mmol/L (ref 1.15–1.40)
Chloride: 103 mmol/L (ref 98–111)
Creatinine, Ser: 1.7 mg/dL — ABNORMAL HIGH (ref 0.61–1.24)
Glucose, Bld: 216 mg/dL — ABNORMAL HIGH (ref 70–99)
HCT: 36 % — ABNORMAL LOW (ref 39.0–52.0)
Hemoglobin: 12.2 g/dL — ABNORMAL LOW (ref 13.0–17.0)
Potassium: 4.5 mmol/L (ref 3.5–5.1)
Sodium: 135 mmol/L (ref 135–145)
TCO2: 23 mmol/L (ref 22–32)

## 2024-08-25 LAB — BASIC METABOLIC PANEL WITH GFR
Anion gap: 14 (ref 5–15)
BUN: 27 mg/dL — ABNORMAL HIGH (ref 8–23)
CO2: 15 mmol/L — ABNORMAL LOW (ref 22–32)
Calcium: 8.3 mg/dL — ABNORMAL LOW (ref 8.9–10.3)
Chloride: 104 mmol/L (ref 98–111)
Creatinine, Ser: 1.67 mg/dL — ABNORMAL HIGH (ref 0.61–1.24)
GFR, Estimated: 42 mL/min — ABNORMAL LOW (ref >60)
Glucose, Bld: 248 mg/dL — ABNORMAL HIGH (ref 70–99)
Potassium: 4.9 mmol/L (ref 3.5–5.1)
Sodium: 133 mmol/L — ABNORMAL LOW (ref 135–145)

## 2024-08-25 LAB — URINALYSIS, ROUTINE W REFLEX MICROSCOPIC
Bilirubin Urine: NEGATIVE
Glucose, UA: 50 mg/dL — AB
Ketones, ur: NEGATIVE mg/dL
Leukocytes,Ua: NEGATIVE
Nitrite: NEGATIVE
Protein, ur: 100 mg/dL — AB
Specific Gravity, Urine: 1.019 (ref 1.005–1.030)
pH: 5 (ref 5.0–8.0)

## 2024-08-25 LAB — TSH: TSH: 2.314 u[IU]/mL (ref 0.350–4.500)

## 2024-08-25 LAB — GLUCOSE, CAPILLARY
Glucose-Capillary: 198 mg/dL — ABNORMAL HIGH (ref 70–99)
Glucose-Capillary: 183 mg/dL — ABNORMAL HIGH (ref 70–99)

## 2024-08-25 LAB — HEMOGLOBIN A1C
Hgb A1c MFr Bld: 7.7 % — ABNORMAL HIGH (ref 4.8–5.6)
Mean Plasma Glucose: 174.29 mg/dL

## 2024-08-25 LAB — VITAMIN B12: Vitamin B-12: 155 pg/mL — ABNORMAL LOW (ref 180–914)

## 2024-08-25 LAB — FOLATE: Folate: 7.2 ng/mL

## 2024-08-25 LAB — HIV ANTIBODY (ROUTINE TESTING W REFLEX): HIV Screen 4th Generation wRfx: NONREACTIVE

## 2024-08-25 LAB — MAGNESIUM: Magnesium: 1.3 mg/dL — ABNORMAL LOW (ref 1.7–2.4)

## 2024-08-25 MED ORDER — ENOXAPARIN SODIUM 40 MG/0.4ML IJ SOSY
40.0000 mg | PREFILLED_SYRINGE | INTRAMUSCULAR | Status: DC
  Administered 2024-08-25 – 2024-08-27 (×3): 40 mg via SUBCUTANEOUS
  Filled 2024-08-25 (×3): qty 0.4

## 2024-08-25 MED ORDER — CYANOCOBALAMIN 1000 MCG/ML IJ SOLN
1000.0000 ug | Freq: Once | INTRAMUSCULAR | Status: DC
  Filled 2024-08-25: qty 1

## 2024-08-25 MED ORDER — ACETAMINOPHEN 650 MG RE SUPP: 650.0000 mg | Freq: Four times a day (QID) | RECTAL | Status: DC | PRN

## 2024-08-25 MED ORDER — PANTOPRAZOLE SODIUM 40 MG PO TBEC
40.0000 mg | DELAYED_RELEASE_TABLET | Freq: Every day | ORAL | Status: DC
Start: 2024-08-25 — End: 2024-08-28
  Administered 2024-08-25 – 2024-08-28 (×4): 40 mg via ORAL
  Filled 2024-08-25 (×4): qty 1

## 2024-08-25 MED ORDER — GADOBUTROL 1 MMOL/ML IV SOLN
10.0000 mL | Freq: Once | INTRAVENOUS | Status: AC | PRN
  Administered 2024-08-25: 10 mL via INTRAVENOUS

## 2024-08-25 MED ORDER — BUPROPION HCL 75 MG PO TABS
75.0000 mg | ORAL_TABLET | Freq: Every day | ORAL | Status: DC
  Administered 2024-08-26 – 2024-08-28 (×3): 75 mg via ORAL
  Filled 2024-08-25 (×4): qty 1

## 2024-08-25 MED ORDER — POTASSIUM CHLORIDE CRYS ER 20 MEQ PO TBCR: 40.0000 meq | EXTENDED_RELEASE_TABLET | Freq: Once | ORAL | Status: DC

## 2024-08-25 MED ORDER — IOHEXOL 350 MG/ML SOLN
75.0000 mL | Freq: Once | INTRAVENOUS | Status: AC | PRN
  Administered 2024-08-25: 75 mL via INTRAVENOUS

## 2024-08-25 MED ORDER — INSULIN ASPART 100 UNIT/ML IJ SOLN
0.0000 [IU] | Freq: Three times a day (TID) | INTRAMUSCULAR | Status: DC
  Administered 2024-08-25 – 2024-08-26 (×2): 4 [IU] via SUBCUTANEOUS
  Administered 2024-08-26 (×2): 7 [IU] via SUBCUTANEOUS
  Administered 2024-08-27 (×2): 4 [IU] via SUBCUTANEOUS
  Administered 2024-08-27: 11 [IU] via SUBCUTANEOUS
  Administered 2024-08-28: 7 [IU] via SUBCUTANEOUS

## 2024-08-25 MED ORDER — MAGNESIUM SULFATE 2 GM/50ML IV SOLN
2.0000 g | INTRAVENOUS | Status: AC
  Administered 2024-08-25: 2 g via INTRAVENOUS
  Filled 2024-08-25: qty 50

## 2024-08-25 MED ORDER — CLOPIDOGREL BISULFATE 75 MG PO TABS
75.0000 mg | ORAL_TABLET | Freq: Every day | ORAL | Status: DC
  Administered 2024-08-25 – 2024-08-28 (×4): 75 mg via ORAL
  Filled 2024-08-25 (×4): qty 1

## 2024-08-25 MED ORDER — SODIUM CHLORIDE 0.9 % IV BOLUS
1000.0000 mL | Freq: Once | INTRAVENOUS | Status: AC
  Administered 2024-08-25: 1000 mL via INTRAVENOUS

## 2024-08-25 MED ORDER — METOPROLOL SUCCINATE ER 25 MG PO TB24
25.0000 mg | ORAL_TABLET | Freq: Every day | ORAL | Status: DC
  Administered 2024-08-25 – 2024-08-28 (×4): 25 mg via ORAL
  Filled 2024-08-25 (×4): qty 1

## 2024-08-25 MED ORDER — COLCHICINE 0.6 MG PO TABS
0.6000 mg | ORAL_TABLET | Freq: Every day | ORAL | Status: DC
  Filled 2024-08-25: qty 1

## 2024-08-25 MED ORDER — LISINOPRIL 10 MG PO TABS: 10.0000 mg | ORAL_TABLET | Freq: Every day | ORAL | Status: DC

## 2024-08-25 MED ORDER — PRAZOSIN HCL 2 MG PO CAPS
4.0000 mg | ORAL_CAPSULE | Freq: Every day | ORAL | Status: DC
  Administered 2024-08-26 – 2024-08-27 (×2): 4 mg via ORAL
  Filled 2024-08-25 (×3): qty 2

## 2024-08-25 MED ORDER — ACETAMINOPHEN 325 MG PO TABS: 650.0000 mg | ORAL_TABLET | Freq: Four times a day (QID) | ORAL | Status: DC | PRN

## 2024-08-25 MED ORDER — FLUOXETINE HCL 20 MG PO CAPS
20.0000 mg | ORAL_CAPSULE | Freq: Every day | ORAL | Status: DC
  Administered 2024-08-25 – 2024-08-28 (×4): 20 mg via ORAL
  Filled 2024-08-25 (×5): qty 1

## 2024-08-25 MED ORDER — POLYETHYLENE GLYCOL 3350 17 G PO PACK: 17.0000 g | PACK | Freq: Every day | ORAL | Status: DC | PRN

## 2024-08-25 MED ORDER — COLCHICINE 0.6 MG PO TABS
1.2000 mg | ORAL_TABLET | Freq: Once | ORAL | Status: AC
  Administered 2024-08-26: 1.2 mg via ORAL
  Filled 2024-08-25: qty 2

## 2024-08-25 MED ORDER — MONTELUKAST SODIUM 10 MG PO TABS
10.0000 mg | ORAL_TABLET | Freq: Every morning | ORAL | Status: DC
  Administered 2024-08-26 – 2024-08-28 (×3): 10 mg via ORAL
  Filled 2024-08-25 (×3): qty 1

## 2024-08-25 MED ORDER — ISOSORBIDE MONONITRATE ER 30 MG PO TB24
30.0000 mg | ORAL_TABLET | Freq: Every day | ORAL | Status: DC
  Administered 2024-08-25 – 2024-08-28 (×4): 30 mg via ORAL
  Filled 2024-08-25 (×4): qty 1

## 2024-08-25 MED ORDER — ATORVASTATIN CALCIUM 40 MG PO TABS
80.0000 mg | ORAL_TABLET | Freq: Every day | ORAL | Status: DC
  Administered 2024-08-25 – 2024-08-27 (×3): 80 mg via ORAL
  Filled 2024-08-25 (×3): qty 2

## 2024-08-25 MED ORDER — ASPIRIN 81 MG PO CHEW
81.0000 mg | CHEWABLE_TABLET | Freq: Every day | ORAL | Status: DC
  Administered 2024-08-25 – 2024-08-28 (×4): 81 mg via ORAL
  Filled 2024-08-25 (×4): qty 1

## 2024-08-25 MED ORDER — ONDANSETRON 4 MG PO TBDP
4.0000 mg | ORAL_TABLET | Freq: Once | ORAL | Status: AC
  Administered 2024-08-25: 4 mg via ORAL
  Filled 2024-08-25: qty 1

## 2024-08-25 MED ORDER — ACETAMINOPHEN 325 MG PO TABS
650.0000 mg | ORAL_TABLET | Freq: Four times a day (QID) | ORAL | Status: AC | PRN
  Administered 2024-08-25: 650 mg via ORAL
  Filled 2024-08-25: qty 2

## 2024-08-25 MED ORDER — SODIUM CHLORIDE 0.9 % IV BOLUS
500.0000 mL | Freq: Once | INTRAVENOUS | Status: AC
  Administered 2024-08-25: 500 mL via INTRAVENOUS

## 2024-08-25 NOTE — Assessment & Plan Note (Addendum)
 Baseline Cr seems to be around 1.2, elevated to 1.93 > 1.7 today on I-stat. S/p 1.5 L NS bolus - Holding Lisinopril  in setting of AKI - Tolerating PO intake, will reassess with AM BMP; if unable to tolerate PO s/p MRI, may consider mIVF w/ good EF on last Echo - Repeat BMP

## 2024-08-25 NOTE — Hospital Course (Addendum)
 Micheal Duncan is a 78 y.o. 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  Weakness of both legs CT head without acute findings (known right sided subdural hematoma with decrease size from prior on current CT).  Lab workup mostly unremarkable aside from B12 deficiency-started supplementation with 1000 mcg IM injection given symptoms. Plan for***course of B12.  MRI thoracic/lumbar significant for multilevel moderate-severe stenosis, but no spinal cord compression nor lesions.  Patient reported history of gout. Uric acid level ***. Trial of colchicine - improved/did not improve symptoms***.  Neurology was consulted, and recommended***.  Neurosurgery was consulted and recommended***.  Acute kidney injury Creatinine of 1.93 on admission, baseline 1.2.  Patient tolerated p.o., with improvement of renal function***.  Type 2 diabetes Held home insulin  70/30, metformin  and semaglutide .  Monitor CBGs with sliding scale and added long-acting insulin  as needed***.  CBGs were well-controlled during hospitalization***.  Regimen at discharge was***.  Other chronic conditions were medically managed with home medications and formulary alternatives as necessary (CAD, COPD, HTN, HLD, GERD, depression/PTSD)  Follow-up recommendations

## 2024-08-25 NOTE — H&P (Addendum)
 Hospital Admission History and Physical Service Pager: [phone]  Patient name: Micheal Duncan Medical record number: [id number] Date of Birth: 1946/04/15 Age: 78 y.o. Gender: male  Primary Care Provider: [facility] Consultants: Neurology, Neurosurgery Code Status: Full Code Preferred Emergency Contact:   Name Relation Home Work [place] Daughter   [phone]   Chief Complaint: b/l leg weakness and N/V  Differential and Medical Decision Making:  Micheal Duncan is a 78 y.o. 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 CT head without acute findings (known right-sided subdural hematoma with decreased size from prior on current CT).  Differential for this patient's presentation of weakness includes: - Transverse myelitis: Given lower extremity weakness and bladder incontinence in setting of recent GI illness; MRI imaging of lumbar and thoracic spine pending to evaluate further and will consult neurology - Cauda equina syndrome: Considering in setting of possible bladder incontinence, though less concern as does not sound like true incontinence vs inability to reach bathroom in time; MRIs pending - Vitamin B12 deficiency: Hx of low B12 requiring supplementation, which he hasn't taken over the past year; rechecking level - Neurosyphilis: Checking RPR - HIV: Checking HIV antibody - Normal pressure hydrocephalus: Does have possible bladder incontinence and inability to walk; can consider need for lumbar puncture pending above workup, will consult neurology - Guillan Barre: Consider given recent likely viral gastroenteritis now with bilateral distal lower extremity weakness - Diabetic neuropathy: Patient does report history of same, though states current symptoms are different  from this  Differential for this patient's presentation of nausea and vomiting includes: - Acute gastroenteritis: Patient with acute onset episodes of N&V; however, no diarrhea, no sick contacts. - Sequelae of VP shunt: Consider; will consult with neurosurgery - Antibiotic reaction: Patient recently completed course of sulfa antibiotic for abscess of the elbow.  Denies nausea and vomiting at onset of antibiotic, but symptoms presented s/p completion. - Foodborne illness: Patient denies recent new foods or other dietary changes. - Gastroparesis: Patient with known history of diabetes, but sudden onset makes this less likely. Assessment & Plan Weakness of both legs Broad differential as above, especially with quick onset of weakness and pain since 9/14, will evaluate further with imaging.  - Admitted to FMTS inpatient service, attending Dr. Carina Brown - MRI lumbar and thoracic spine pending - Neurosurgery consulted about VP shunt in place prior to MRI - Neurology consulted, appreciate recs - Fall and delirium precautions in place - PT/OT eval - Labs: RPR, HIV, Folate, Vit B12, TSH, A1c - AM Labs: CBC, BMP, Mag N&V (nausea and vomiting) Last episode of vomiting this morning 9/16. Has tolerated po since. - Add diet once finished with MRI AKI (acute kidney injury) (HCC) Baseline Cr seems to be around 1.2, elevated to 1.93 > 1.7 today on I-stat. S/p 1.5 L NS bolus - Holding Lisinopril  in setting of AKI - Tolerating PO intake, will reassess with AM BMP; if unable to tolerate PO s/p MRI, may consider mIVF w/ good EF on last Echo - Repeat BMP T2DM (type 2 diabetes mellitus) (HCC) Holding home medications (Insulin  70/30 [50u in the morning & 50u in the evening], Metformin  500 mg daily, Semaglutide  1 mg weekly [on Wed]). - SSI, will add on Lantus  prn as he tolerates PO intake Chronic health problem CAD:  Continuing home ASA 81 mg, Plavix  75 mg, Imdur  30 mg COPD: Per patient, taking inhalers  prn; holding inhalers for now, consider restarting as indicated HTN: Continuing home Toprol  25mg , holding lisinopril  10mg  in setting of AKI. HLD: Continuing home Lipitor  80 mg GERD: Continuing home Protonix  40 mg OSA: Not on CPAP; consider starting if hypoxic overnight/in morning Depression/PTSD: Continuing home bupropion  75 mg, Prozac  20 mg, prazosin  4 mg at bedtime  FEN/GI: Pending MRI VTE Prophylaxis: Lovenox   Disposition: Med-Surg  History of Present Illness:  Micheal Duncan is a 78 y.o. male presenting with nausea and vomiting for 1 week, but since Sunday 9/14 he has been having more pain around his bilateral ankles which has caused him weakness in his bilateral legs. He has required to use a walker instead of his normal cane to get around. Denies any fevers or chills. Urinary incontinence likely due to not being strong enough to make it to the bathroom. Denies loss of sensation in legs or perineal region.  Denies bowel incontinence.  Denies any new rashes, reheated meals, or new medications. Not currently taking Vitamin B12, last took this a year ago.  In the ED, he was given 1.5 L of fluids with normal saline.  Was found to have low mag at 1.3 and repleted with 2 g IV mag.  Given Zofran  4 mg p.o. and Tylenol  650 mg p.o. once.  CT head done which showed no acute process.  Chest x-ray also without acute process.  ET abdomen without acute process.  Briefly required oxygen this morning for O2 sat of 88%, patient was not wearing CPAP.  Review Of Systems: Per HPI.  Pertinent Past Medical History: - Chronic SDH s/p embolization 04/2023 - CAD s/p CABG - Hypercholesteremia - T2DM - HTN - CKD Stage 3b - COPD - GERD - OSA not on CPAP - Hx ETOH, Cocaine use Remainder reviewed in history tab.   Pertinent Past Surgical History: - CABG 2007 - Cardiac PCI x2 - VP shunt 06/2022 Remainder reviewed in history tab.   Pertinent Social History: Tobacco use: Vapes, every day; former smoker  (quit 6 years, smoked for 60 years) Alcohol  use: Prior Other Substance use: Prior cocaine use (quit 78 yo ago) Lives with alone with his dog [place]  Pertinent Family History: - DM in mother - Aneurysm in father - Cancer (?stomach?) in sister  Important Outpatient Medications: - Plavix  75 mg daily - ASA 81 mg daily - Imdur  30 mg daily - Lipitor  80 mg daily - Lasix  40 mg daily - not taking - Lisinopril  10 mg daily - Toprol  25 mg daily  - Insulin  70/30 - 50u in the morning & 50u in the evening - Metformin  500 mg daily - Semaglutide  1 mg weekly (switched back to 1 mg on Wednesday was not handling 2 mg well) - Omeprazole 40 mg daily - Fluticasone /Salmeterol 1 puff prn - Impratropium 2 sprays in each nostril prn - Singulair  10 mg daily - Albuterol  prn - Buproprion 75 mg daily - Fluoxetine  20 mg daily - Prazosin  4 mg daily at bedtime - Melatonin 3 mg at bedtime prn - Thiamine  100 mg daily - not taking, stopped maybe a year ago - Vitamin B12 1000 mcg daily - not taking, stopped maybe a year ago - Mirabegron 25 mg daily - Recently completed Bactrim (completed 9/13)  Objective: BP (!) 144/60   Pulse 80   Temp 97.6 F (36.4 C) (Oral)   Resp 18   Ht 5' 8 (  1.727 m)   Wt 104.3 kg   SpO2 98%   BMI 34.97 kg/m  Exam: General: Pt lying back in bed, no acute distress. Eyes: EOMI bilaterally, sclera non icteric, conjunctiva non injected Neck: Supple Cardiovascular: Regular rate and rhythm, no murmurs/rubs/gallops. Respiratory: Normal work of breathing on room air. Clear to auscultation bilaterally; no wheezes, crackles. Gastrointestinal: Bowel sounds present and normoactive bilaterally. Soft, nondistended, nontender. MSK: No gross abnormalities of bilateral upper or lower extremities. Strength 5/5 in bilateral upper extremities. Strength 4/5 in bilateral lower extremities. Reflexes in bilateral knees 1+. Equal sensation. Derm: No rash seen of visible skin. Neuro: Alert and oriented  to person, place, time (day of week, date), event. Psych: Appropriate affect.  Labs:  CBC BMET  Recent Labs  Lab 08/24/24 2150 08/25/24 1053  WBC 8.0  --   HGB 13.9 12.2*  HCT 43.4 36.0*  PLT 206  --    Recent Labs  Lab 08/24/24 2150 08/25/24 1053  NA 129* 135  K 5.5* 4.5  CL 99 103  CO2 17*  --   BUN 21 30*  CREATININE 1.93* 1.70*  GLUCOSE 292* 216*  CALCIUM  8.6*  --      Mag: 1.3  Urinalysis: No evidence of UTI; protein 100 Resp Panel: Negative  EKG: My own interpretation (not copied from electronic read): NSR, ?RBBB?; unchanged from prior 08/2023 EKG    Imaging Studies Performed:  9/15 CT Head: No acute component; ventriculostomy catheter in good position; decreased in right-sided subdural hematoma when compared with prior exam.  9/15 CXR:  Impression from Radiologist: No acute process; shunt catheter tubing in right chest.  My Interpretation: No opacities seen in bilateral lungs. No acute findings. Post-CABG changes.  9/16 CTAP:  1. No acute findings in the abdomen or pelvis related to LLQ abdominal pain. 2. Numerous colonic diverticula without evidence of diverticulitis.   Micheal Palma, MD 08/25/2024, 3:44 PM PGY-1, [facility]  FPTS Intern pager: [phone], text pages welcome Secure chat group [facility] Teaching Service   I have discussed the above with Dr. Larraine and agree with the documented plan. My edits for correction/addition/clarification are included above. Please see any attending notes.   Micheal Melena, DO PGY-2, [place] 08/25/2024 5:21 PM

## 2024-08-25 NOTE — ED Notes (Signed)
 Patient transported to MRI

## 2024-08-25 NOTE — ED Provider Notes (Signed)
 Patient here with headache nausea and vomiting x 3 days and constipation.  History of VP shunt which appears to be working appropriately.  Patient with positive AKI hyponatremia and hypokalemia vomiting is currently controlled awaiting CT imaging with suspected need for admission. Physical Exam  BP 131/67   Pulse 79   Temp 97.8 F (36.6 C) (Oral)   Resp 18   Ht 5' 8 (1.727 m)   Wt 104.3 kg   SpO2 99%   BMI 34.97 kg/m   Physical Exam  Procedures  Procedures  ED Course / MDM   Clinical Course as of 08/25/24 1146  Tue Aug 25, 2024  1116 Patient reevaluated at bedside.  I discussed findings of CTs with the patient and his daughter at bedside.  He states that he is feeling much better and no longer vomiting.  Patient however is here because he has been too weak to walk which represents an abrupt change in his ambulatory status since yesterday.  Patient's daughter says that normally he can ambulate with a cane however last night when she came to get him he was unable to even stand up to transfer and she had to wheel him in on a rollator. [AH]  1117 On my examination patient appears to have predominantly distal weakness which is worse with dorsiflexion at the ankle.  Reflexes are muted at the ankle and knee.  He proximal muscle strength seems to be 4 out of 5.  I did attempt to stand the patient at bedside and he was unable to hold his own body weight so was placed back in the bed. [AH]    Clinical Course User Index [AH] Arloa Chroman, PA-C   Medical Decision Making Amount and/or Complexity of Data Reviewed Labs: ordered. Radiology: ordered. ECG/medicine tests: ordered.  Risk OTC drugs. Prescription drug management. Decision regarding hospitalization.   This is a 78 year old gentleman who presents to the emergency department with nausea and vomiting as well as acute onset of bilateral lower extremity weakness. Labs reviewed are fairly reassuring patient does have hypomagnesemia  which certainly could be contributing to his weakness.  I discussed the case with Dr. Deedra who recommended MRI of the lumbar spine.  I have ordered this.  I discussed the case also with family medicine teaching service who astutely is concerned for potential for transverse myelitis so we have added on CT with and without contrast of the cervical and thoracic spine.  Patient has been given IV magnesium  and fluids he has had no repeat vomiting here in the emergency department.  While he does have some pain he is in Narcotics Anonymous and declines use of opiates.  Patient would benefit from admission for further assessment.       Arloa Chroman, PA-C 08/25/24 1719    Ula Prentice SAUNDERS, MD 08/26/24 [phone]

## 2024-08-25 NOTE — ED Notes (Signed)
 Patient placed on 2L [place] due to room air sat 88%. Patient endorses sleep apnea.

## 2024-08-25 NOTE — ED Notes (Signed)
 Patient reports bilateral ankle pain describes as arthritis. Patient reports unable to bear weight due to pain. Patient also reports also having headache and chills without fever.

## 2024-08-25 NOTE — Plan of Care (Addendum)
 FMTS Brief Progress Note  S: No acute concerns.  Still endorsing lower extremity weakness and pain in his ankle/feet bilaterally.   O: BP (!) 168/65   Pulse 91   Temp 97.8 F (36.6 C)   Resp 18   Ht 5' 8 (1.727 m)   Wt 104.3 kg   SpO2 99%   BMI 34.97 kg/m    General: NAD, pleasant, eating dinner Cardio: RRR, no MRG.  Respiratory: CTAB, normal wob on RA GI: Abdomen is soft, not tender, not distended. BS present Bilateral ankles/feet: No gross deformity, no ecchymosis.  Mild nonpitting edema bilateral ankles, swelling of feet, warmth but no erythema, pain on palpation but not out of proportion.  Strength exam limited by pain.  A/P: LE weakness MRI thoracic/lumbar with multiple levels of stenosis but no impingement/other spinal cord pathology.  Prior history of B12 deficiency, unable to see if he is been worked up for pernicious anemia.  CBC within normal limits.  B12 of 155 today, given concurrent symptomology will dose with IM B12. - Continue plan as outlined by day team - 1000 mcg B12 IM, frequency thereafter per day team tomorrow  R worse than L foot/ankle pain Bilateral foot/ankle pain, right worse than left.  Noticeable warmth and swelling of ankle bilaterally, without erythema or extension beyond ankle/foot.  Reported trouble weightbearing, tenderness to light palpation although not out of proportion to exam.  Patient reported history of gout in right foot. - Uric acid - Trial colchicine  - Monitor closely for signs of infection  - Orders reviewed. Labs for AM ordered, which was adjusted as needed.  - If condition changes, plan includes page family medicine teaching service.   Howell Lunger, DO 08/25/2024, 8:16 PM PGY-3, [place] Night Resident  Please page [phone] with questions.

## 2024-08-25 NOTE — Assessment & Plan Note (Signed)
 Last episode of vomiting this morning 9/16. Has tolerated po since. - Add diet once finished with MRI

## 2024-08-25 NOTE — Consult Note (Signed)
 NEUROLOGY CONSULT NOTE   Date of service: August 25, 2024 Patient Name: Micheal Duncan MRN:  [id number] DOB:  May 19, 1946 Chief Complaint: Bilateral lower extremity weakness and bilateral arthritic foot pain limiting ambulation, in a patient with nausea vomiting and history of VP shunt Requesting Provider: Delores Suzann HERO, MD  History of Present Illness  Micheal Duncan is a 78 y.o. male with a  PMHx of NPH s/p VP shunt, subdural hematoma, anemia, arthritis, skin cancer, CKD, COPD, CAD, DM2, depression, GERD, hypercholesterolemia, stated history of gout (not listed in Epic), HTN, mild memory loss, CAD s/p CABG and stenting, neuromuscular disorder, PTSD (veteran of the armed forces), sleep apnea, stroke and prior EtOH and cocaine use (now abstinent) who presented to the hospital with new onset of bilateral lower extremity weakness and bilateral arthritic foot pain limiting ambulation. He also has experienced worsened swelling, warmth and redness to a recently drained cyst on his right elbow. The above symptom worsening followed after a few days of nausea and vomiting. Nausea and vomiting improved by time of ED assessment.   He states that his BLE feel weaker and that this has progressed over several months. He states that his ability to ambulate has worsened over the weekend due to worsening of his chronic bilateral arthritic foot pain. He has had chronic foot pain for a long period of time that is worse in the AM on awakening and typically improves with walking. He states that over the weekend this pain has worsened to the point that he cannot relieve it with walking as he usually is able to do. No arm weakness. Has intermittent headaches as well. At baseline he uses a cane to ambulate.     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   Chronic kidney disease    Complication of anesthesia    Fentanyl  allergy   COPD (chronic obstructive pulmonary disease) (HCC)    Coronary artery disease    Depression    Diabetes mellitus    type 2   Dyspnea    GERD (gastroesophageal reflux disease)    Hypercholesteremia    Hypertension    Memory loss    mild   Myocardial infarction (HCC) 2002   x several years - right before bypass surgery   N&V (nausea and vomiting) 08/25/2024   Neuromuscular disorder (HCC)    Pneumonia 11/2021   PTSD (post-traumatic stress disorder)    Sleep apnea    does not use cpap   Stroke (HCC) 12/2021   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   Past Surgical History:  Procedure Laterality Date   CARPAL TUNNEL RELEASE     right   COLONOSCOPY     x several   CORONARY ARTERY BYPASS GRAFT     CORONARY STENT PLACEMENT     HAND SURGERY Bilateral    for skin cancer   IR ANGIO EXTERNAL CAROTID SEL EXT CAROTID UNI R MOD SED  04/23/2023   IR ANGIO INTRA EXTRACRAN SEL INTERNAL CAROTID UNI R MOD SED  04/23/2023   IR ANGIOGRAM FOLLOW UP STUDY  04/23/2023   IR NEURO EACH ADD'L AFTER BASIC UNI RIGHT (MS)  04/23/2023   IR TRANSCATH/EMBOLIZ  04/23/2023   LAPAROSCOPIC  REVISION VENTRICULAR-PERITONEAL (V-P) SHUNT N/A 07/02/2022   Procedure: LAPAROSCOPIC VENTRICULAR-PERITONEAL (V-P) SHUNT;  Surgeon: Kinsinger, Herlene Righter, MD;  Location: [facility] OR;  Service: General;  Laterality: N/A;   MOHS SURGERY     x several @ [facility]   RADIOLOGY WITH ANESTHESIA N/A 04/23/2023   Procedure: Right middle meningeal artery embolization;  Surgeon: Lanis Pupa, MD;  Location: [facility] OR;  Service: Radiology;  Laterality: N/A;   THROAT SURGERY     UPPER GI ENDOSCOPY     VENTRICULOPERITONEAL SHUNT Right 07/02/2022   Procedure: LAPAROSCOPIC ASSISTED SHUNT INSERTION VENTRICULAR-PERITONEAL;  Surgeon: Lanis Pupa, MD;  Location: [facility] OR;  Service: Neurosurgery;  Laterality: Right;  3C    Family  History: Family History  Problem Relation Age of Onset   Aneurysm Father    Diabetes Mother    Cancer Maternal Uncle        unknown to pt   Cancer Maternal Grandmother        ?stomach   Cancer Sister        ?stomach   Cancer Maternal Uncle        unknown to pt   Cancer Maternal Uncle        unknown to pt    Social History  reports that he quit smoking about 7 years ago. His smoking use included cigarettes. He started smoking about 52 years ago. He has a 22.5 pack-year smoking history. He has quit using smokeless tobacco. He reports that he does not currently use alcohol . He reports current drug use. Drug: Cocaine.  Allergies  Allergen Reactions   Fentanyl  Itching    Confusion, skin crawling   Duloxetine Other (See Comments)    Unknown   Gabapentin Swelling    Medications   Current Facility-Administered Medications:    acetaminophen  (TYLENOL ) tablet 650 mg, 650 mg, Oral, Q6H PRN **OR** acetaminophen  (TYLENOL ) suppository 650 mg, 650 mg, Rectal, Q6H PRN, Larraine Palma, MD   aspirin  chewable tablet 81 mg, 81 mg, Oral, Daily, Nemecek, Amanda, MD, 81 mg at 08/25/24 1831   atorvastatin  (LIPITOR ) tablet 80 mg, 80 mg, Oral, QHS, Nemecek, Amanda, MD   buPROPion  (WELLBUTRIN ) tablet 75 mg, 75 mg, Oral, Daily, Nemecek, Amanda, MD   clopidogrel  (PLAVIX ) tablet 75 mg, 75 mg, Oral, Daily, Nemecek, Amanda, MD   colchicine  tablet 1.2 mg, 1.2 mg, Oral, Once **FOLLOWED BY** [START ON 08/26/2024] colchicine  tablet 0.6 mg, 0.6 mg, Oral, Daily, Bronson, Martin, DO   cyanocobalamin  (VITAMIN B12) injection 1,000 mcg, 1,000 mcg, Intramuscular, Once, Howell Lunger, DO   enoxaparin  (LOVENOX ) injection 40 mg, 40 mg, Subcutaneous, Q24H, Nemecek, Amanda, MD   FLUoxetine  (PROZAC ) capsule 20 mg, 20 mg, Oral, Daily, Nemecek, Amanda, MD   insulin  aspart (novoLOG ) injection 0-20 Units, 0-20 Units, Subcutaneous, TID WC, Nemecek, Amanda, MD, 4 Units at 08/25/24 1839   isosorbide  mononitrate (IMDUR ) 24 hr  tablet 30 mg, 30 mg, Oral, Daily, Nemecek, Amanda, MD   metoprolol  succinate (TOPROL -XL) 24 hr tablet 25 mg, 25 mg, Oral, Daily, Nemecek, Palma, MD   [START ON 08/26/2024] montelukast  (SINGULAIR ) tablet 10 mg, 10 mg, Oral, q morning, Nemecek, Amanda, MD   pantoprazole  (PROTONIX ) EC tablet 40 mg, 40 mg, Oral, Daily, Nemecek, Amanda, MD   polyethylene glycol (MIRALAX  / GLYCOLAX ) packet 17 g, 17 g, Oral, Daily PRN, Nemecek, Amanda, MD   prazosin  (MINIPRESS ) capsule 4 mg, 4 mg, Oral, QHS, Nemecek, Amanda, MD  No current facility-administered medications on file prior to encounter.   Current Outpatient Medications on File Prior  to Encounter  Medication Sig Dispense Refill   acetaminophen  (TYLENOL ) 500 MG tablet Take 500 mg by mouth every 6 (six) hours as needed for mild pain or moderate pain.     albuterol  (PROVENTIL  HFA;VENTOLIN  HFA) 108 (90 BASE) MCG/ACT inhaler Inhale 2 puffs into the lungs 4 (four) times daily as needed for wheezing or shortness of breath.     albuterol  (PROVENTIL ) (2.5 MG/3ML) 0.083% nebulizer solution Take 1 ampule by nebulization every 6 (six) hours as needed (COPD).     aspirin  81 MG chewable tablet Chew 1 tablet (81 mg total) by mouth daily.     atorvastatin  (LIPITOR ) 80 MG tablet Take 80 mg by mouth at bedtime.     buPROPion  (WELLBUTRIN ) 75 MG tablet Take 75 mg by mouth daily.     cetirizine (ZYRTEC) 10 MG tablet Take 10 mg by mouth at bedtime.     clopidogrel  (PLAVIX ) 75 MG tablet Take 75 mg by mouth daily.     FLUoxetine  (PROZAC ) 20 MG tablet Take 20 mg by mouth daily.     fluticasone  (FLONASE ) 50 MCG/ACT nasal spray Place 2 sprays into both nostrils daily as needed for rhinitis or allergies.     furosemide  (LASIX ) 40 MG tablet Take 40 mg by mouth daily as needed for fluid.     insulin  aspart protamine- aspart (NOVOLOG  MIX 70/30) (70-30) 100 UNIT/ML injection Inject 45-50 Units into the skin 2 (two) times daily.     isosorbide  mononitrate (IMDUR ) 30 MG 24 hr tablet Take  30 mg by mouth daily.     LACTOBACILLUS PO Take 2 capsules by mouth every morning.     lisinopril  (ZESTRIL ) 10 MG tablet Take 10 mg by mouth daily.     metFORMIN  (GLUCOPHAGE -XR) 500 MG 24 hr tablet Take 500 mg by mouth daily.     metoprolol  succinate (TOPROL -XL) 25 MG 24 hr tablet Take 25 mg by mouth daily.     mirabegron ER (MYRBETRIQ) 25 MG TB24 tablet Take 25 mg by mouth daily.     montelukast  (SINGULAIR ) 10 MG tablet Take 10 mg by mouth every morning.     nitroGLYCERIN  (NITROSTAT ) 0.4 MG SL tablet Place 0.4 mg under the tongue every 5 (five) minutes as needed for chest pain.     omeprazole (PRILOSEC) 20 MG capsule Take 40 mg by mouth every morning.     prazosin  (MINIPRESS ) 2 MG capsule Take 4 mg by mouth at bedtime.     Propylene Glycol (SYSTANE BALANCE OP) Place 1 drop into both eyes 4 (four) times daily as needed (Dry eyes).     Semaglutide , 1 MG/DOSE, 4 MG/3ML SOPN Inject 1 mg into the skin once a week. On Wednesday     senna-docusate (SENOKOT-S) 8.6-50 MG tablet Take 2 tablets by mouth every evening.     doxycycline (VIBRAMYCIN) 100 MG capsule Take 100 mg by mouth 2 (two) times daily. (Patient not taking: Reported on 08/25/2024)     lisinopril  (ZESTRIL ) 5 MG tablet Take 10 mg by mouth daily. (Patient not taking: Reported on 08/25/2024)     methocarbamol  (ROBAXIN ) 500 MG tablet Take 500 mg by mouth in the morning and at bedtime. (Patient not taking: Reported on 08/25/2024)     sulfamethoxazole-trimethoprim (BACTRIM DS) 800-160 MG tablet Take 1 tablet by mouth 2 (two) times daily. (Patient not taking: Reported on 08/25/2024)        Vitals   Vitals:   08/25/24 1400 08/25/24 1638 08/25/24 1816 08/25/24 2005  BP: (!) 144/60  ROLLEN)  154/59 (!) 168/65  Pulse: 80  86 91  Resp: 18  18   Temp:  97.8 F (36.6 C) 98 F (36.7 C) 97.8 F (36.6 C)  TempSrc:  Oral Oral   SpO2: 98%  97% 99%  Weight:      Height:        Body mass index is 34.97 kg/m.   Physical Exam   Constitutional: Appears  well-developed and well-nourished.  Psych: Affect appropriate to situation.  Eyes: No scleral injection.  HENT: No OP obstruction.  Head: Normocephalic.  Respiratory: Effort normal, non-labored breathing.    Neurologic Examination   Mental Status: Alert, oriented x 5, thought content appropriate.  Speech fluent without evidence of aphasia.  Able to follow all commands without difficulty. Cranial Nerves: II: Temporal visual fields intact with no extinction to DSS. PERRL  III,IV, VI: No ptosis. EOMI.  V: Temp sensation equal bilaterally  VII: Smile symmetric VIII: Hearing intact to voice IX,X: No hoarseness XI: Symmetric shoulder shrug XII: Midline tongue extension Motor: BUE 5/5 proximally and distally BLE with significant giveway weakness due to pain. Maximum strength elicitable is 4/5 hip flexion, knee flexion, knee extension, ADF and APF bilaterally  Sensory: Temp and light touch intact throughout, bilaterally, including the feet. No extinction to DSS.  Deep Tendon Reflexes: 2+ and symmetric bilateral brachioradialis and biceps. 1+ bilateral patellars. 0 bilateral achilles. Right toe equivocal. Left toe downgoing.  Cerebellar: No ataxia with FNF bilaterally  Gait: Deferred  Labs/Imaging/Neurodiagnostic studies   CBC:  Recent Labs  Lab 2024/09/03 2150 08/25/24 1053  WBC 8.0  --   NEUTROABS 6.5  --   HGB 13.9 12.2*  HCT 43.4 36.0*  MCV 90.6  --   PLT 206  --    Basic Metabolic Panel:  Lab Results  Component Value Date   NA 133 (L) 08/25/2024   K 4.9 08/25/2024   CO2 15 (L) 08/25/2024   GLUCOSE 248 (H) 08/25/2024   BUN 27 (H) 08/25/2024   CREATININE 1.67 (H) 08/25/2024   CALCIUM  8.3 (L) 08/25/2024   GFRNONAA 42 (L) 08/25/2024   GFRAA 78 (L) 01/16/2013   Lipid Panel:  Lab Results  Component Value Date   LDLCALC 51 05/14/2012   HgbA1c:  Lab Results  Component Value Date   HGBA1C 7.7 (H) 08/25/2024   Urine Drug Screen:     Component Value Date/Time    LABOPIA NONE DETECTED 05/13/2012 1423   COCAINSCRNUR NONE DETECTED 05/13/2012 1423   LABBENZ NONE DETECTED 05/13/2012 1423   AMPHETMU NONE DETECTED 05/13/2012 1423   THCU NONE DETECTED 05/13/2012 1423   LABBARB NONE DETECTED 05/13/2012 1423    Alcohol  Level     Component Value Date/Time   ETH <11 05/13/2012 1408    ASSESSMENT  TARUN PATCHELL is a 78 y.o. male presenting with gradual onset of bilateral lower extremity weakness followed by acute worsening, in conjunction with bilateral arthritic foot pain limiting ambulation. Also with nausea and vomiting in the context of history of VP shunt placement for NPH.  - Exam reveals BLE giveway weakness due to pain, with maximum strength elicitable rated at 4/5 proximally and distally. Temp and light touch intact throughout, bilaterally, including the feet. DTRs are 2+ and symmetric bilateral brachioradialis and biceps, with 1+ bilateral patellars and 0 bilateral achilles. Right toe equivocal. Left toe downgoing.  - Labs: HgbA1c elevated at 7.7.  - CT head: Significant decrease in right-sided subdural hematoma when compared with the prior exam. No acute  component is seen. No midline shift is noted. Ventriculostomy catheter in satisfactory position. No other focal abnormality is noted. 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 Moderate to severe left neural foraminal stenosis at L5-S1. Moderate bilateral neural foraminal stenosis at L4-5. - MRI T-spine: Motion degraded examination. Mild spinal stenosis at T8-9, T9-10, and T10-11. Normal appearance of the thoracic spinal cord. - Impression:  - DDx for his BLE weakness is likely multifactorial, including possible B12 deficiency, arthritis and chronic deconditioning. His NPH may also be playing a role. Recent subdural hematoma is now decreased in size from prior  scan, which suggests that the subdural is not playing a role in his acute worsening. 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  - NPH with VP shunt. Recent nausea and vomiting may be secondary to shunt malfunction. - Right elbow cyst appears inflamed. Patient states that this is significantly affecting his quality of life and that the cyst has actually worsened since recent prior outpatient aspiration and ABX.   RECOMMENDATIONS  - Neurosurgery plans to follow up with the patient tomorrow and reprogram VP shunt as needed and provide any further recommendations pending MRI results.  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  - Orthopedics consult for possible aspiration of his right olecranon cyst, with gram stain, microscopic examination and culture of the aspirated fluid.  ______________________________________________________________________    Bonney SHARK, Latasha Buczkowski, MD [facility]

## 2024-08-25 NOTE — Assessment & Plan Note (Addendum)
 CAD: Continuing home ASA 81 mg, Plavix  75 mg, Imdur  30 mg COPD: Per patient, taking inhalers prn; holding inhalers for now, consider restarting as indicated HTN: Continuing home Toprol  25mg , holding lisinopril  10mg  in setting of AKI. HLD: Continuing home Lipitor  80 mg GERD: Continuing home Protonix  40 mg OSA: Not on CPAP; consider starting if hypoxic overnight/in morning Depression/PTSD: Continuing home bupropion  75 mg, Prozac  20 mg, prazosin  4 mg at bedtime

## 2024-08-25 NOTE — ED Notes (Signed)
 Pt returned from MRI

## 2024-08-25 NOTE — Plan of Care (Signed)
 Consulted Neurology and Neurosurgery, appreciate their consult and recommendations.   Spoke with Dr. Voncile from neurology and he accepted the consult, night team will likely be seeing the patient and following up with any recs s/p MRI results.   Spoke with Camie Pickle, PA who consented to the patient having an MRI performed with VP shunt. NSGY plans to follow up with the patient tomorrow and reprogram VP shunt as needed and provide any further recommendations pending MRI results.   Kathrine Melena, DO 08/25/24 5:29 PM

## 2024-08-25 NOTE — Assessment & Plan Note (Addendum)
 Broad differential as above, especially with quick onset of weakness and pain since 9/14, will evaluate further with imaging.  - Admitted to [facility] inpatient service, attending Dr. Carina Brown - MRI lumbar and thoracic spine pending - Neurosurgery consulted about VP shunt in place prior to MRI - Neurology consulted, appreciate recs - Fall and delirium precautions in place - PT/OT eval - Labs: RPR, HIV, Folate, Vit B12, TSH, A1c - AM Labs: CBC, BMP, Mag

## 2024-08-25 NOTE — Assessment & Plan Note (Addendum)
 Holding home medications (Insulin  70/30 [50u in the morning & 50u in the evening], Metformin  500 mg daily, Semaglutide  1 mg weekly [on Wed]). - SSI, will add on Lantus  prn as he tolerates PO intake

## 2024-08-25 NOTE — ED Provider Notes (Signed)
 [place] EMERGENCY DEPARTMENT AT [facility] Provider Note   CSN: [id number] Arrival date & time: 08/24/24  1953     Patient presents with: Headache   Micheal Duncan is a 78 y.o. male.   Headache Patient is a 78 year old male presenting ED today for concerns for multiple complaints.  Noting to have had a headache, persistent nausea and vomiting x 3 days.  Noted to also have been feeling constipated with abdominal pain present x 1 week with last bowel movement 1 week ago.  Noted to have recently finished full course of antibiotics for right elbow swelling and pain, noting crystals.  Patient uncertain if it was gout, pseudogout or cyst.  Previous medical history of  fecal incontinence, hydrocephalus with VP shunt, chronic subdural hematoma, CAD, MI, COPD, substance abuse, diabetes, GERD, CKD, stroke  Denies fevers, blurry vision, vertigo, tinnitus, dysphagia, odynophagia, unilateral weakness, worsening chest pain, worsening shortness of breath, lower leg swelling, dysuria.     Prior to Admission medications   Medication Sig Start Date End Date Taking? Authorizing Provider  acetaminophen  (TYLENOL ) 500 MG tablet Take 500 mg by mouth every 6 (six) hours as needed for mild pain or moderate pain.    [provider]  albuterol  (PROVENTIL  HFA;VENTOLIN  HFA) 108 (90 BASE) MCG/ACT inhaler Inhale 2 puffs into the lungs 4 (four) times daily as needed for wheezing or shortness of breath.    [provider]  albuterol  (PROVENTIL ) (2.5 MG/3ML) 0.083% nebulizer solution Take 1 ampule by nebulization every 6 (six) hours as needed (COPD).    [provider]  aspirin  81 MG chewable tablet Chew 1 tablet (81 mg total) by mouth daily. 04/30/23   Johnanna Credit Caylin, PA-C  atorvastatin  (LIPITOR ) 80 MG tablet Take 80 mg by mouth at bedtime.    [provider]  buPROPion  (WELLBUTRIN ) 75 MG tablet Take 75 mg by mouth daily. 05/08/22   [provider]   cetirizine (ZYRTEC) 10 MG tablet Take 10 mg by mouth at bedtime.    [provider]  clopidogrel  (PLAVIX ) 75 MG tablet Take 75 mg by mouth daily. 04/30/23   Johnanna Credit Caylin, PA-C  FLUoxetine  (PROZAC ) 20 MG tablet Take 20 mg by mouth daily.    [provider]  fluticasone  (FLONASE ) 50 MCG/ACT nasal spray Place 2 sprays into both nostrils daily as needed for rhinitis or allergies.    [provider]  furosemide  (LASIX ) 40 MG tablet Take 40 mg by mouth daily. 06/03/23   [provider]  insulin  aspart protamine- aspart (NOVOLOG  MIX 70/30) (70-30) 100 UNIT/ML injection Inject 45-50 Units into the skin 2 (two) times daily. 09/12/21   [provider]  isosorbide  mononitrate (IMDUR ) 30 MG 24 hr tablet Take 30 mg by mouth daily.    [provider]  LACTOBACILLUS PO Take 2 capsules by mouth every morning.    [provider]  lisinopril  (ZESTRIL ) 5 MG tablet Take 10 mg by mouth daily.    [provider]  metFORMIN  (GLUCOPHAGE -XR) 500 MG 24 hr tablet Take 500 mg by mouth daily. 03/06/22   [provider]  methocarbamol  (ROBAXIN ) 500 MG tablet Take 500 mg by mouth in the morning and at bedtime.    [provider]  metoprolol  succinate (TOPROL -XL) 25 MG 24 hr tablet Take 25 mg by mouth daily. 06/16/21   [provider]  montelukast  (SINGULAIR ) 10 MG tablet Take 10 mg by mouth every morning.    [provider]  nitroGLYCERIN  (NITROSTAT ) 0.4 MG SL tablet Place 0.4 mg under the tongue every 5 (five) minutes as needed for chest pain.    [provider]  omeprazole (PRILOSEC) 20 MG capsule Take 40 mg by mouth every morning.    [provider]  OVER THE COUNTER MEDICATION Place 1 application  into both eyes at bedtime as needed (dry eyes). Ocuvite eye ointment 3.5    [provider]  oxybutynin  (DITROPAN ) 5 MG tablet Take 5 mg by mouth every morning.    [provider]   prazosin  (MINIPRESS ) 2 MG capsule Take 4 mg by mouth at bedtime.    [provider]  Propylene Glycol (SYSTANE BALANCE OP) Place 1 drop into both eyes 4 (four) times daily as needed (Dry eyes).    [provider]  Semaglutide , 1 MG/DOSE, 4 MG/3ML SOPN Inject 1 mg into the skin once a week. On Wednesday 09/12/21   [provider]  senna-docusate (SENOKOT-S) 8.6-50 MG tablet Take 2 tablets by mouth daily.    [provider]  thiamine  100 MG tablet Take 100 mg by mouth daily.    [provider]  vitamin B-12 (CYANOCOBALAMIN ) 500 MCG tablet Take 1,000 mcg by mouth daily.    [provider]    Allergies: Fentanyl , Duloxetine, and Gabapentin    Review of Systems  Neurological:  Positive for headaches.    Updated Vital Signs BP 131/67   Pulse 79   Temp 97.8 F (36.6 C) (Oral)   Resp 18   Ht 5' 8 (1.727 m)   Wt 104.3 kg   SpO2 99%   BMI 34.97 kg/m   Physical Exam Vitals and nursing note reviewed.  Constitutional:      General: He is not in acute distress.    Appearance: Normal appearance. He is not ill-appearing or diaphoretic.  HENT:     Head: Normocephalic and atraumatic.  Eyes:     General: No scleral icterus.       Right eye: No discharge.        Left eye: No discharge.     Extraocular Movements: Extraocular movements intact.     Conjunctiva/sclera: Conjunctivae normal.  Cardiovascular:     Rate and Rhythm: Normal rate and regular rhythm.     Pulses: Normal pulses.     Heart sounds: Normal heart sounds. No murmur heard.    No friction rub. No gallop.  Pulmonary:     Effort: Pulmonary effort is normal. No respiratory distress.     Breath sounds: No stridor. No wheezing, rhonchi or rales.  Chest:     Chest wall: No tenderness.  Abdominal:     General: Abdomen is flat. There is no distension.     Palpations: Abdomen is soft.     Tenderness: There is abdominal tenderness (Tenderness and guarding noted to left lower  quadrant.). There is guarding. There is no right CVA tenderness, left CVA tenderness or rebound.  Musculoskeletal:        General: No swelling, deformity or signs of injury.     Cervical back: Normal range of motion. No rigidity.     Right lower leg: No edema.     Left lower leg: No edema.  Skin:    General: Skin is warm and dry.     Findings: No bruising, erythema or lesion.  Neurological:     General: No focal deficit present.     Mental Status: He is alert and oriented to person, place, and  time. Mental status is at baseline.     Cranial Nerves: No cranial nerve deficit.     Sensory: No sensory deficit.     Motor: No weakness.     Coordination: Coordination normal.     Comments: No facial asymmetry, no ataxia, no apraxia, no aphasia, no arm drift, normal coordination with finger-to-nose, normal sensation to both upper and lower extremities bilaterally, normal grip strength bilaterally, normal strength to both flexion and extension to both upper lower extremities 5+ bilaterally, no visual field deficits, no nystagmus.   Psychiatric:        Mood and Affect: Mood normal.     (all labs ordered are listed, but only abnormal results are displayed) Labs Reviewed  CBC WITH DIFFERENTIAL/PLATELET - Abnormal; Notable for the following components:      Result Value   Lymphs Abs 0.5 (*)    All other components within normal limits  COMPREHENSIVE METABOLIC PANEL WITH GFR - Abnormal; Notable for the following components:   Sodium 129 (*)    Potassium 5.5 (*)    CO2 17 (*)    Glucose, Bld 292 (*)    Creatinine, Ser 1.93 (*)    Calcium  8.6 (*)    Albumin 3.4 (*)    GFR, Estimated 36 (*)    All other components within normal limits  MAGNESIUM  - Abnormal; Notable for the following components:   Magnesium  1.3 (*)    All other components within normal limits  RESP PANEL BY RT-PCR (RSV, FLU A&B, COVID)  [id number]  URINALYSIS, ROUTINE W REFLEX MICROSCOPIC    EKG: None  Radiology: DG Chest  2 View Result Date: 08/24/2024 EXAM: 2 VIEW(S) XRAY OF THE CHEST 08/24/2024 11:29:00 PM COMPARISON: None available. CLINICAL HISTORY: Vomiting, confusion. Reports headache with nausea and vomiting. Patient threw up in the parking lot. Daughter states today he called her at and stated yesterday he lost the ability to stand. Patient is also incontient and has been unable to hold anything down. Patient has a shunt for hydrocephalus. FINDINGS: LINES, TUBES AND DEVICES: Shunt catheter tubing in right chest. LUNGS AND PLEURA: No focal pulmonary opacity. No pulmonary edema. No pleural effusion. No pneumothorax. HEART AND MEDIASTINUM: Sternal wires with normal cardiac silhouette. Postsurgical changes from CABG. BONES AND SOFT TISSUES: No acute osseous abnormality. IMPRESSION: 1. No acute process. 2. Shunt catheter tubing in right chest. Electronically signed by: Dorethia Molt MD 08/24/2024 11:36 PM EDT RP Workstation: [id number]   CT Head Wo Contrast Result Date: 08/24/2024 CLINICAL DATA:  Altered mental status EXAM: CT HEAD WITHOUT CONTRAST TECHNIQUE: Contiguous axial images were obtained from the base of the skull through the vertex without intravenous contrast. RADIATION DOSE REDUCTION: This exam was performed according to the departmental dose-optimization program which includes automated exposure control, adjustment of the mA and/or kV according to patient size and/or use of iterative reconstruction technique. COMPARISON:  08/29/2023 FINDINGS: Brain: Extra-axial fluid collection is again identified on the right. It measures approximately 10 mm in greatest thickness and is significantly decreased when compared with the prior exam. No hyperdense component to suggest acute hemorrhage is identified. No significant midline shift is noted. No acute hemorrhage or acute infarct is seen. Ventriculostomy catheter is noted on the right extending into the left lateral ventricle stable from the prior exam. Vascular: No  hyperdense vessel or unexpected calcification. Skull: Normal. Negative for fracture or focal lesion. Sinuses/Orbits: No acute finding. Other: None. IMPRESSION: Significant decrease in right-sided subdural hematoma when compared with the prior exam.  No acute component is seen. No midline shift is noted. Ventriculostomy catheter in satisfactory position. No other focal abnormality is noted. Electronically Signed   By: Oneil Devonshire M.D.   On: 08/24/2024 23:10   Procedures   Medications Ordered in the ED  acetaminophen  (TYLENOL ) tablet 650 mg (650 mg Oral Given 08/25/24 0516)  ondansetron  (ZOFRAN -ODT) disintegrating tablet 4 mg (4 mg Oral Given 08/25/24 0520)  sodium chloride  0.9 % bolus 500 mL (500 mLs Intravenous New Bag/Given 08/25/24 [id number])     Medical Decision Making Risk OTC drugs. Prescription drug management.   This patient is a 78 year old male who presents to the ED for concern of multiple complaints.  Concern for headache that been ongoing with nausea and vomiting x 3 days accompanied by left lower quadrant abdominal pain present x 3 days noting to have had a last bowel movement 1 week ago.  On physical exam, patient is in no acute distress, afebrile, alert and orient x 4, speaking in full sentences, nontachypneic, nontachycardic.  Normal neuroexam  Differential diagnoses prior to evaluation: The emergent differential diagnosis includes, but is not limited to, Mesenteric ischemia, diverticulitis, nephrolithiasis, constipation, bowel obstruction, IBD, testicular torsion, tension headache, migraine, polypharmacy, substance abuse, sinusitis, cervicogenic headache, dehydration, cluster headache, trigeminal neuralgia, IIH, PRES syndrome, intracranial bleed, CVA, shunt defect this is not an exhaustive differential.   Past Medical History / Co-morbidities / Social History: Noted to have history of fecal incontinence, hydrocephalus with VP shunt, chronic subdural hematoma, CAD, MI, COPD,  substance abuse, diabetes, GERD, CKD, stroke  Additional history: Chart reviewed. Pertinent results include:   Underwent right meningeal artery embolization for chronic subdural hematomas.  Lab Tests/Imaging studies: I personally interpreted labs/imaging and the pertinent results include:   CBC unremarkable CMP notes a hyponatremia of 129 as well as a hyperkalemia 5.5 , Noted to have an increased creatinine 1.93 compared to previous 1 year ago 1.67 as well as decreased GFR of 36 compared to previous 43 UA pending Magnesium  pending Resp panel negative Chest x-ray shows shunt catheter tubing with no other acute process CT head shows significant decrease in right sided subdural hematoma with no other acute component seen. Ventriculostomy catheter in satisfactory position.  CT abdomen and pelvis pending I agree with the radiologist interpretation.  Cardiac monitoring: EKG obtained and interpreted by myself and attending physician which shows: Sinus rhythm with nonspecific ST abnormality noted.   Medications: I ordered medication including Zofran  and Tylenol .  I have reviewed the patients home medicines and have made adjustments as needed.  Critical Interventions: None  Social Determinants of Health: None  Disposition: 6:48 AM Care of [facility] transferred to [employer] PA-C at the end of my shift as the patient will require reassessment once labs/imaging have resulted. Patient presentation, ED course, and plan of care discussed with review of all pertinent labs and imaging. Please see his/her note for further details regarding further ED course and disposition. Plan at time of handoff is await CT abdomen and labs, treat Hyperkalemia and will likely admit. This may be altered or completely changed at the discretion of the oncoming team pending results of further workup.   Final diagnoses:  Nausea and vomiting, unspecified vomiting type  LLQ abdominal pain  Acute  nonintractable headache, unspecified headache type    ED Discharge Orders     None          Beola Terrall RAMAN, [place] 08/25/24 [id number]    Raford Lenis, MD 08/25/24 [phone]

## 2024-08-26 DIAGNOSIS — E538 Deficiency of other specified B group vitamins: Secondary | ICD-10-CM | POA: Diagnosis present

## 2024-08-26 DIAGNOSIS — R29898 Other symptoms and signs involving the musculoskeletal system: Secondary | ICD-10-CM | POA: Diagnosis not present

## 2024-08-26 DIAGNOSIS — M25571 Pain in right ankle and joints of right foot: Secondary | ICD-10-CM | POA: Insufficient documentation

## 2024-08-26 DIAGNOSIS — M7031 Other bursitis of elbow, right elbow: Secondary | ICD-10-CM | POA: Insufficient documentation

## 2024-08-26 DIAGNOSIS — R509 Fever, unspecified: Secondary | ICD-10-CM | POA: Insufficient documentation

## 2024-08-26 LAB — BASIC METABOLIC PANEL WITH GFR
Anion gap: 15 (ref 5–15)
BUN: 22 mg/dL (ref 8–23)
CO2: 18 mmol/L — ABNORMAL LOW (ref 22–32)
Calcium: 8.5 mg/dL — ABNORMAL LOW (ref 8.9–10.3)
Chloride: 100 mmol/L (ref 98–111)
Creatinine, Ser: 1.44 mg/dL — ABNORMAL HIGH (ref 0.61–1.24)
GFR, Estimated: 51 mL/min — ABNORMAL LOW (ref >60)
Glucose, Bld: 167 mg/dL — ABNORMAL HIGH (ref 70–99)
Potassium: 4.5 mmol/L (ref 3.5–5.1)
Sodium: 133 mmol/L — ABNORMAL LOW (ref 135–145)

## 2024-08-26 LAB — MAGNESIUM: Magnesium: 2 mg/dL (ref 1.7–2.4)

## 2024-08-26 LAB — CBC
HCT: 36.3 % — ABNORMAL LOW (ref 39.0–52.0)
Hemoglobin: 12.1 g/dL — ABNORMAL LOW (ref 13.0–17.0)
MCH: 29.6 pg (ref 26.0–34.0)
MCHC: 33.3 g/dL (ref 30.0–36.0)
MCV: 88.8 fL (ref 80.0–100.0)
Platelets: 175 K/uL (ref 150–400)
RBC: 4.09 MIL/uL — ABNORMAL LOW (ref 4.22–5.81)
RDW: 13 % (ref 11.5–15.5)
WBC: 5.6 K/uL (ref 4.0–10.5)
nRBC: 0 % (ref 0.0–0.2)

## 2024-08-26 LAB — GLUCOSE, CAPILLARY
Glucose-Capillary: 163 mg/dL — ABNORMAL HIGH (ref 70–99)
Glucose-Capillary: 237 mg/dL — ABNORMAL HIGH (ref 70–99)
Glucose-Capillary: 216 mg/dL — ABNORMAL HIGH (ref 70–99)
Glucose-Capillary: 196 mg/dL — ABNORMAL HIGH (ref 70–99)

## 2024-08-26 LAB — URIC ACID: Uric Acid, Serum: 7.5 mg/dL (ref 3.7–8.6)

## 2024-08-26 LAB — RPR: RPR Ser Ql: NONREACTIVE

## 2024-08-26 MED ORDER — COLCHICINE 0.6 MG PO TABS
0.6000 mg | ORAL_TABLET | Freq: Every day | ORAL | Status: DC
  Administered 2024-08-27 – 2024-08-28 (×2): 0.6 mg via ORAL
  Filled 2024-08-26 (×2): qty 1

## 2024-08-26 MED ORDER — CYANOCOBALAMIN 1000 MCG/ML IJ SOLN
1000.0000 ug | Freq: Every day | INTRAMUSCULAR | Status: DC
  Administered 2024-08-26 – 2024-08-28 (×3): 1000 ug via INTRAMUSCULAR
  Filled 2024-08-26 (×3): qty 1

## 2024-08-26 NOTE — Assessment & Plan Note (Signed)
 Normal uric acid level of 7.5. - Colchicine  1.2 mg today, followed by Colchicine  0.6 mg daily starting tom 9/18

## 2024-08-26 NOTE — Consult Note (Signed)
 HPI:     Patient is a 78 y.o. male with a PMH of NPH s/p VP shunt ([employer] Delia, 2023 Dr. Lanis), R MMA embolization, DM, HLD, HTN, CAD presents with chronic BLE pain from feet up to mid lower leg, increasing for the last week or so. Denies ass'd N/T/W. Also with some urinary incontinence and N/V. Underwent L/T spine MRI, NSGY asked to evaluate shunt status.     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/2022   Fecal incontinence 03/24/2013   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   Chronic kidney disease    Complication of anesthesia    Fentanyl  allergy   COPD (chronic obstructive pulmonary disease) (HCC)    Coronary artery disease    Depression    Diabetes mellitus    type 2   Dyspnea    GERD (gastroesophageal reflux disease)    Hypercholesteremia    Hypertension    Memory loss    mild   Myocardial infarction (HCC) 2002   x several years - right before bypass surgery   N&V (nausea and vomiting) 08/25/2024   Neuromuscular disorder (HCC)    Pneumonia 11/2021   PTSD (post-traumatic stress disorder)    Sleep apnea    does not use cpap   Stroke (HCC) 12/2021   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    Past Surgical History:  Procedure Laterality Date   CARPAL TUNNEL RELEASE     right   COLONOSCOPY     x several   CORONARY ARTERY BYPASS GRAFT     CORONARY STENT PLACEMENT     HAND SURGERY Bilateral    for skin cancer   IR ANGIO EXTERNAL CAROTID SEL EXT CAROTID  UNI R MOD SED  04/23/2023   IR ANGIO INTRA EXTRACRAN SEL INTERNAL CAROTID UNI R MOD SED  04/23/2023   IR ANGIOGRAM FOLLOW UP STUDY  04/23/2023   IR NEURO EACH ADD'L AFTER BASIC UNI RIGHT (MS)  04/23/2023   IR TRANSCATH/EMBOLIZ  04/23/2023   LAPAROSCOPIC REVISION VENTRICULAR-PERITONEAL (V-P) SHUNT N/A 07/02/2022   Procedure: LAPAROSCOPIC VENTRICULAR-PERITONEAL (V-P) SHUNT;  Surgeon: Stevie Herlene Righter, MD;  Location: [facility] OR;  Service: General;  Laterality: N/A;   MOHS SURGERY     x several @ [facility]   RADIOLOGY WITH ANESTHESIA N/A 04/23/2023   Procedure: Right middle meningeal artery embolization;  Surgeon: Lanis Pupa, MD;  Location: [facility] OR;  Service: Radiology;  Laterality: N/A;   THROAT SURGERY     UPPER GI ENDOSCOPY     VENTRICULOPERITONEAL SHUNT Right 07/02/2022   Procedure: LAPAROSCOPIC ASSISTED SHUNT INSERTION VENTRICULAR-PERITONEAL;  Surgeon: Lanis Pupa, MD;  Location: [facility] OR;  Service: Neurosurgery;  Laterality: Right;  3C    Medications Prior to Admission  Medication Sig Dispense Refill Last Dose/Taking   acetaminophen  (TYLENOL ) 500 MG tablet Take 500 mg by mouth every 6 (six) hours as needed for mild pain or moderate pain.  Past Week   albuterol  (PROVENTIL  HFA;VENTOLIN  HFA) 108 (90 BASE) MCG/ACT inhaler Inhale 2 puffs into the lungs 4 (four) times daily as needed for wheezing or shortness of breath.   Past Month   albuterol  (PROVENTIL ) (2.5 MG/3ML) 0.083% nebulizer solution Take 1 ampule by nebulization every 6 (six) hours as needed (COPD).   Past Month   aspirin  81 MG chewable tablet Chew 1 tablet (81 mg total) by mouth daily.   08/24/2024 Morning   atorvastatin  (LIPITOR ) 80 MG tablet Take 80 mg by mouth at bedtime.   08/23/2024   buPROPion  (WELLBUTRIN ) 75 MG tablet Take 75 mg by mouth daily.   08/24/2024 Morning   cetirizine (ZYRTEC) 10 MG tablet Take 10 mg by mouth at bedtime.   08/23/2024   clopidogrel  (PLAVIX ) 75 MG tablet Take 75 mg by mouth daily.   08/24/2024  Morning   FLUoxetine  (PROZAC ) 20 MG tablet Take 20 mg by mouth daily.   08/24/2024 Morning   fluticasone  (FLONASE ) 50 MCG/ACT nasal spray Place 2 sprays into both nostrils daily as needed for rhinitis or allergies.   Past Week   furosemide  (LASIX ) 40 MG tablet Take 40 mg by mouth daily as needed for fluid.   Past Month   insulin  aspart protamine- aspart (NOVOLOG  MIX 70/30) (70-30) 100 UNIT/ML injection Inject 45-50 Units into the skin 2 (two) times daily.   08/24/2024 Morning   isosorbide  mononitrate (IMDUR ) 30 MG 24 hr tablet Take 30 mg by mouth daily.   08/24/2024 Morning   LACTOBACILLUS PO Take 2 capsules by mouth every morning.   08/24/2024 Morning   lisinopril  (ZESTRIL ) 10 MG tablet Take 10 mg by mouth daily.   08/24/2024 Morning   metFORMIN  (GLUCOPHAGE -XR) 500 MG 24 hr tablet Take 500 mg by mouth daily.   08/24/2024 Morning   metoprolol  succinate (TOPROL -XL) 25 MG 24 hr tablet Take 25 mg by mouth daily.   08/24/2024 Morning   mirabegron ER (MYRBETRIQ) 25 MG TB24 tablet Take 25 mg by mouth daily.   08/24/2024 Morning   montelukast  (SINGULAIR ) 10 MG tablet Take 10 mg by mouth every morning.   08/24/2024 Morning   nitroGLYCERIN  (NITROSTAT ) 0.4 MG SL tablet Place 0.4 mg under the tongue every 5 (five) minutes as needed for chest pain.   Past Month   omeprazole (PRILOSEC) 20 MG capsule Take 40 mg by mouth every morning.   08/24/2024 Morning   prazosin  (MINIPRESS ) 2 MG capsule Take 4 mg by mouth at bedtime.   08/23/2024   Propylene Glycol (SYSTANE BALANCE OP) Place 1 drop into both eyes 4 (four) times daily as needed (Dry eyes).   Past Week   Semaglutide , 1 MG/DOSE, 4 MG/3ML SOPN Inject 1 mg into the skin once a week. On Wednesday   08/19/2024   senna-docusate (SENOKOT-S) 8.6-50 MG tablet Take 2 tablets by mouth every evening.   08/23/2024   doxycycline (VIBRAMYCIN) 100 MG capsule Take 100 mg by mouth 2 (two) times daily. (Patient not taking: Reported on 08/25/2024)   Not Taking   lisinopril  (ZESTRIL ) 5 MG  tablet Take 10 mg by mouth daily. (Patient not taking: Reported on 08/25/2024)   Not Taking   methocarbamol  (ROBAXIN ) 500 MG tablet Take 500 mg by mouth in the morning and at bedtime. (Patient not taking: Reported on 08/25/2024)   Not Taking   sulfamethoxazole-trimethoprim (BACTRIM DS) 800-160 MG tablet Take 1 tablet by mouth 2 (two) times daily. (Patient not taking: Reported on 08/25/2024)   Not Taking  Allergies  Allergen Reactions   Fentanyl  Itching    Confusion, skin crawling   Duloxetine Other (See Comments)    Unknown   Gabapentin Swelling    Social History   Tobacco Use   Smoking status: Former    Current packs/day: 0.00    Average packs/day: 0.5 packs/day for 45.0 years (22.5 ttl pk-yrs)    Types: Cigarettes    Start date: 67    Quit date: 2018    Years since quitting: 7.7   Smokeless tobacco: Former   Tobacco comments:    Vapes  Substance Use Topics   Alcohol  use: Not Currently    Comment: quit 14 yrs ago as of 06/2022    Family History  Problem Relation Age of Onset   Aneurysm Father    Diabetes Mother    Cancer Maternal Uncle        unknown to pt   Cancer Maternal Grandmother        ?stomach   Cancer Sister        ?stomach   Cancer Maternal Uncle        unknown to pt   Cancer Maternal Uncle        unknown to pt    Objective:   Patient Vitals for the past 8 hrs:  BP Temp Pulse Resp SpO2  08/26/24 0801 134/70 97.8 F (36.6 C) 77 17 97 %  08/26/24 0347 (!) 153/57 99.8 F (37.7 C) 86 -- 96 %   I/O last 3 completed shifts: In: -  Out: 350 [Urine:350] No intake/output data recorded. MR THORACIC SPINE W WO CONTRAST Result Date: 08/25/2024 CLINICAL DATA:  Myelopathy, chronic, thoracic spine. Bilateral lower extremity weakness. EXAM: MRI THORACIC WITHOUT AND WITH CONTRAST TECHNIQUE: Multiplanar and multiecho pulse sequences of the thoracic spine were obtained without and with intravenous contrast. CONTRAST:  10mL GADAVIST  GADOBUTROL  1 MMOL/ML IV SOLN  COMPARISON:  None Available. FINDINGS: Axial T2 spin echo and gradient echo sequences are moderately to severely motion degraded. Alignment: Normal. Vertebrae: No fracture or suspicious marrow lesion. Very mild edema in the T2 posterior elements which is nonspecific but may be related to mild T1-2 facet arthritis. Cord:  Normal cord signal.  No abnormal intradural enhancement. Paraspinal and other soft tissues: Unremarkable. Disc levels: At T8-9, a small right paracentral disc protrusion and moderate right greater than left facet and ligamentum flavum hypertrophy result in mild spinal stenosis. Facet and ligamentum flavum hypertrophy also result in mild spinal stenosis at T9-10 and T10-11. There is mild bilateral neural foraminal stenosis at T10-11. IMPRESSION: 1. Motion degraded examination. 2. Mild spinal stenosis at T8-9, T9-10, and T10-11. 3. Normal appearance of the thoracic spinal cord. Electronically Signed   By: Dasie Hamburg M.D.   On: 08/25/2024 17:52   MR LUMBAR SPINE W WO CONTRAST Result Date: 08/25/2024 CLINICAL DATA:  Myelopathy, acute, lumbar spine. Bilateral lower extremity weakness. EXAM: MRI LUMBAR SPINE WITHOUT AND WITH CONTRAST TECHNIQUE: Multiplanar and multiecho pulse sequences of the lumbar spine were obtained without and with intravenous contrast. CONTRAST:  10mL GADAVIST  GADOBUTROL  1 MMOL/ML IV SOLN COMPARISON:  CT abdomen and pelvis 08/25/2024 FINDINGS: Segmentation:  Standard. Alignment:  Normal. Vertebrae: No fracture, suspicious marrow lesion, or significant marrow edema. Conus medullaris and cauda equina: Conus extends to the L1 level. Conus and cauda equina appear normal. Paraspinal and other soft tissues: Unremarkable. Disc levels: Disc desiccation throughout the lumbar spine. Mild disc space narrowing at L3-4 and L4-5. T12-L1: Mild facet hypertrophy without disc  herniation or stenosis. L1-2: Mild facet hypertrophy without disc herniation or stenosis. L2-3: Mild disc bulging,  congenitally short pedicles, mildly prominent epidural fat, and moderate facet and ligamentum flavum hypertrophy result in mild spinal stenosis and mild bilateral neural foraminal stenosis. L3-4: Disc bulging, congenitally short pedicles, mildly prominent epidural fat, and moderate facet hypertrophy result in moderate spinal stenosis, mild bilateral lateral recess stenosis, and mild-to-moderate bilateral neural foraminal stenosis. L4-5: Disc bulging, congenitally short pedicles, mildly prominent epidural fat, and severe facet and ligamentum flavum hypertrophy result in moderate to severe spinal stenosis and moderate bilateral neural foraminal stenosis. L5-S1: Disc bulging eccentric to the left and moderate to severe facet hypertrophy result in mild right and moderate to severe left neural foraminal stenosis without spinal stenosis. IMPRESSION: 1. 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 2. Moderate to severe left neural foraminal stenosis at L5-S1. 3. Moderate bilateral neural foraminal stenosis at L4-5. Electronically Signed   By: Dasie Hamburg M.D.   On: 08/25/2024 17:45   Awake, alert, oriented Speech fluent, appropriate CN grossly intact Shunt valve reservoir easily depresses and refills briskly Strength/sensation grossly intact BUE/BLE R ankle diffusely ttp Not hyperflexic Neg clonus    Assessment:   Principal Problem:   Weakness of both legs Active Problems:   Chronic health problem   T2DM (type 2 diabetes mellitus) (HCC)   N&V (nausea and vomiting)   AKI (acute kidney injury) (HCC)   B12 deficiency   Bilateral ankle pain  This is a 78yo male who is s/p VP shunt for NPH in 2023 who is s/p L/T spine MRI for evaluation of LE weakness. MRI positive for multilevel diffuse degenerative spondylosis  Plan:   -Patient to follow up in outpatient clinic for further  evaluation and mgmt of chronic degenerative spine disease.   -Shunt evaluated and found to be at setting of 2.0. Reset to setting of 1.5 at bedside per most recent OV documentation. Questions encouraged/answered.   Wen Merced CAYLIN Patty Lopezgarcia, PA-C

## 2024-08-26 NOTE — Evaluation (Signed)
 Physical Therapy Evaluation Patient Details Name: Micheal Duncan MRN: [id number] DOB: 03-10-1946 Today's Date: 08/26/2024  History of Present Illness  Pt is a 78 yr old male who presents 08/24/24 due to B LE weakness and n/v.  MRI showed multiple levels of stenosis but no acute findings. Pt found to have B12 deficiency. Pt with bilateral foot/ankle pain with ?gout. 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 Pt needs skilled PT to maximize independence and safety. Pt lives alone and is typically independent. Able to amb in hallway with assist. Reports the pain in his feet/ankles is slightly better but still present. Patient will benefit from intensive inpatient follow-up therapy, >3 hours/day. If pain in feet rapidly improves may need to update dc recommendation.           If plan is discharge home, recommend the following: A little help with walking and/or transfers;A little help with bathing/dressing/bathroom;Help with stairs or ramp for entrance   Can travel by private vehicle        Equipment Recommendations None recommended by PT  Recommendations for Other Services       Functional Status Assessment Patient has had a recent decline in their functional status and demonstrates the ability to make significant improvements in function in a reasonable and predictable amount of time.     Precautions / Restrictions Precautions Precautions: Fall Recall of Precautions/Restrictions: Intact Precaution/Restrictions Comments: Pt reported no recent falls but has felt unsteady and required going to cane to walker Restrictions Weight Bearing Restrictions Per Provider Order: No      Mobility  Bed Mobility Overal bed mobility:  Needs Assistance Bed Mobility: Supine to Sit     Supine to sit: Min assist     General bed mobility comments: Assist to elevate trunk into sitting    Transfers Overall transfer level: Needs assistance Equipment used: Rolling walker (2 wheels) Transfers: Sit to/from Stand Sit to Stand: Min assist           General transfer comment: Assist to power up    Ambulation/Gait Ambulation/Gait assistance: Min assist Gait Distance (Feet): 100 Feet Assistive device: Rolling walker (2 wheels) Gait Pattern/deviations: Step-through pattern, Decreased step length - right, Decreased step length - left, Decreased stride length, Antalgic Gait velocity: decr Gait velocity interpretation: <1.31 ft/sec, indicative of household ambulator   General Gait Details: Assist for balance and support. Tentative steps and reliance on walker due to pain in feet.  Stairs            Wheelchair Mobility     Tilt Bed    Modified Rankin (Stroke Patients Only)       Balance Overall balance assessment: Needs assistance Sitting-balance support: Feet supported Sitting balance-Leahy Scale: Good     Standing balance support: Bilateral upper extremity supported Standing balance-Leahy Scale: Poor Standing balance comment: UE support                             Pertinent Vitals/Pain Pain Assessment Pain Assessment: Faces Faces Pain Scale: Hurts even more Pain Location: Bil feet/ankles Pain Descriptors / Indicators: Aching, Dull, Grimacing, Guarding Pain Intervention(s): Monitored during session, Limited activity within patient's tolerance    Home Living Family/patient  expects to be discharged to:: Private residence Living Arrangements: Alone Available Help at Discharge: Available PRN/intermittently (However, daughter reported they might be able to live in with pt or they can come to their house if needed.) Type of Home: House Home Access: Stairs to enter;Ramped entrance    Entrance Stairs-Number of Steps: 1 Alternate Level Stairs-Number of Steps: a couple of steps Home Layout: Multi-level;Able to live on main level with bedroom/bathroom Home Equipment: Rolling Walker (2 wheels);Rollator (4 wheels);Cane - single point;Shower seat - built in      Prior Function Prior Level of Function : Independent/Modified Independent             Mobility Comments: Reported recently needing to go from cane to use of walker as started to decline in mobility and difficulties with transfers ADLs Comments: indep in ADLs, has a housekeeper, driving but not on highways     Extremity/Trunk Assessment             Cervical / Trunk Assessment Cervical / Trunk Assessment: Kyphotic  Communication   Communication Communication: No apparent difficulties Factors Affecting Communication: Hearing impaired    Cognition Arousal: Alert Behavior During Therapy: WFL for tasks assessed/performed   PT - Cognitive impairments: No apparent impairments                         Following commands: Intact       Cueing Cueing Techniques: Verbal cues     General Comments      Exercises     Assessment/Plan    PT Assessment Patient needs continued PT services  PT Problem List Decreased strength;Decreased mobility;Decreased balance;Pain       PT Treatment Interventions DME instruction;Gait training;Stair training;Functional mobility training;Therapeutic activities;Therapeutic exercise;Balance training;Patient/family education    PT Goals (Current goals can be found in the Care Plan section)  Acute Rehab PT Goals Patient Stated Goal: go home PT Goal Formulation: With patient/family Time For Goal Achievement: 09/09/24 Potential to Achieve Goals: Good    Frequency Min 2X/week     Co-evaluation               AM-PAC PT 6 Clicks Mobility  Outcome Measure Help needed turning from your back to your side while in a flat bed without using bedrails?:  None Help needed moving from lying on your back to sitting on the side of a flat bed without using bedrails?: A Little Help needed moving to and from a bed to a chair (including a wheelchair)?: A Little Help needed standing up from a chair using your arms (e.g., wheelchair or bedside chair)?: A Little Help needed to walk in hospital room?: A Little Help needed climbing 3-5 steps with a railing? : A Lot 6 Click Score: 18    End of Session Equipment Utilized During Treatment: Gait belt Activity Tolerance: Patient tolerated treatment well Patient left: in chair;with call bell/phone within reach;with family/visitor present   PT Visit Diagnosis: Other abnormalities of gait and mobility (R26.89);Muscle weakness (generalized) (M62.81);Pain Pain - Right/Left:  (bil) Pain - part of body: Ankle and joints of foot    Time: 8591-8576 PT Time Calculation (min) (ACUTE ONLY): 15 min   Charges:   PT Evaluation $PT Eval Low Complexity: 1 Low   PT General Charges $$ ACUTE PT VISIT: 1 Visit         [facility] PT Acute Rehabilitation Services Office [phone]   [facility] 08/26/2024, 4:09 PM

## 2024-08-26 NOTE — Assessment & Plan Note (Addendum)
None reported today.

## 2024-08-26 NOTE — Assessment & Plan Note (Addendum)
 Cr today of 1.44. Baseline around 1.2.  - Improving with PO intake, no indication for mIVF at this time - Will continue to trend Cr. - Holding Lisinopril  in setting of AKI, consider restarting tomorrow 9/18

## 2024-08-26 NOTE — Consult Note (Signed)
 Reason for Consult:Right olecranon bursitis Referring Physician: Suzann Daring Time called: 1216 Time at bedside: 1237   Micheal Duncan is an 78 y.o. male.  HPI: Micheal Duncan was admitted yesterday with BLE weakness. He also was noted to have a right olecranon bursitis and as he spiked a low-grade fever last night orthopedic surgery was asked to consult. The elbow has been swollen for about 3 weeks. He went to urgent care and had I&D with what sounds like possibly tophaceous material encountered. He compete a round of oral abx. He c/o mild pain at the elbow and reaccumulation of fluid but pain is markedly better from onset. He has hx/o gout but only in his feet. He is RHD.  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   Chronic kidney disease    Complication of anesthesia    Fentanyl  allergy   COPD (chronic obstructive pulmonary disease) (HCC)    Coronary artery disease    Depression    Diabetes mellitus    type 2   Dyspnea    GERD (gastroesophageal reflux disease)    Hypercholesteremia    Hypertension    Memory loss    mild   Myocardial infarction (HCC) 2002   x several years - right before bypass surgery   N&V (nausea and vomiting) 08/25/2024   Neuromuscular disorder (HCC)    Pneumonia 11/2021   PTSD (post-traumatic stress disorder)    Sleep apnea    does not use cpap   Stroke (HCC) 12/2021   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    Past Surgical History:  Procedure Laterality Date   CARPAL TUNNEL RELEASE     right   COLONOSCOPY     x several   CORONARY ARTERY BYPASS GRAFT     CORONARY STENT PLACEMENT     HAND SURGERY Bilateral    for skin cancer   IR ANGIO EXTERNAL CAROTID SEL EXT CAROTID UNI R MOD SED  04/23/2023   IR ANGIO INTRA EXTRACRAN SEL INTERNAL CAROTID UNI R MOD SED  04/23/2023   IR ANGIOGRAM FOLLOW UP STUDY  04/23/2023   IR NEURO EACH ADD'L AFTER BASIC UNI RIGHT  (MS)  04/23/2023   IR TRANSCATH/EMBOLIZ  04/23/2023   LAPAROSCOPIC REVISION VENTRICULAR-PERITONEAL (V-P) SHUNT N/A 07/02/2022   Procedure: LAPAROSCOPIC VENTRICULAR-PERITONEAL (V-P) SHUNT;  Surgeon: Stevie Herlene Righter, MD;  Location: [facility] OR;  Service: General;  Laterality: N/A;   MOHS SURGERY     x several @ [facility]   RADIOLOGY WITH ANESTHESIA N/A 04/23/2023   Procedure: Right middle meningeal artery embolization;  Surgeon: Lanis Pupa, MD;  Location: [facility] OR;  Service: Radiology;  Laterality: N/A;   THROAT SURGERY     UPPER GI ENDOSCOPY     VENTRICULOPERITONEAL SHUNT Right 07/02/2022   Procedure: LAPAROSCOPIC ASSISTED SHUNT INSERTION VENTRICULAR-PERITONEAL;  Surgeon: Lanis Pupa, MD;  Location: [facility] OR;  Service: Neurosurgery;  Laterality: Right;  3C    Family History  Problem Relation Age of Onset   Aneurysm Father    Diabetes Mother    Cancer Maternal Uncle        unknown to pt   Cancer Maternal Grandmother        ?stomach   Cancer Sister        ?stomach   Cancer Maternal Uncle  unknown to pt   Cancer Maternal Uncle        unknown to pt    Social History:  reports that he quit smoking about 7 years ago. His smoking use included cigarettes. He started smoking about 52 years ago. He has a 22.5 pack-year smoking history. He has quit using smokeless tobacco. He reports that he does not currently use alcohol . He reports current drug use. Drug: Cocaine.  Allergies:  Allergies  Allergen Reactions   Fentanyl  Itching    Confusion, skin crawling   Duloxetine Other (See Comments)    Unknown   Gabapentin Swelling    Medications: I have reviewed the patient's current medications.  Results for orders placed or performed during the hospital encounter of 08/24/24 (from the past 48 hours)  CBC with Differential     Status: Abnormal   Collection Time: 08/24/24  9:50 PM  Result Value Ref Range   WBC 8.0 4.0 - 10.5 K/uL   RBC 4.79 4.22 - 5.81 MIL/uL   Hemoglobin  13.9 13.0 - 17.0 g/dL   HCT 56.5 60.9 - 47.9 %   MCV 90.6 80.0 - 100.0 fL   MCH 29.0 26.0 - 34.0 pg   MCHC 32.0 30.0 - 36.0 g/dL   RDW 86.9 88.4 - 84.4 %   Platelets 206 150 - 400 K/uL   nRBC 0.0 0.0 - 0.2 %   Neutrophils Relative % 80 %   Neutro Abs 6.5 1.7 - 7.7 K/uL   Lymphocytes Relative 7 %   Lymphs Abs 0.5 (L) 0.7 - 4.0 K/uL   Monocytes Relative 11 %   Monocytes Absolute 0.9 0.1 - 1.0 K/uL   Eosinophils Relative 0 %   Eosinophils Absolute 0.0 0.0 - 0.5 K/uL   Basophils Relative 1 %   Basophils Absolute 0.1 0.0 - 0.1 K/uL   Immature Granulocytes 1 %   Abs Immature Granulocytes 0.04 0.00 - 0.07 K/uL    Comment: Performed at [facility], [address] [place], [place] [address]  Comprehensive metabolic panel     Status: Abnormal   Collection Time: 08/24/24  9:50 PM  Result Value Ref Range   Sodium 129 (L) 135 - 145 mmol/L   Potassium 5.5 (H) 3.5 - 5.1 mmol/L   Chloride 99 98 - 111 mmol/L   CO2 17 (L) 22 - 32 mmol/L   Glucose, Bld 292 (H) 70 - 99 mg/dL    Comment: Glucose reference range applies only to samples taken after fasting for at least 8 hours.   BUN 21 8 - 23 mg/dL   Creatinine, Ser 8.06 (H) 0.61 - 1.24 mg/dL   Calcium  8.6 (L) 8.9 - 10.3 mg/dL   Total Protein 6.7 6.5 - 8.1 g/dL   Albumin 3.4 (L) 3.5 - 5.0 g/dL   AST 22 15 - 41 U/L   ALT 36 0 - 44 U/L   Alkaline Phosphatase 91 38 - 126 U/L   Total Bilirubin 0.9 0.0 - 1.2 mg/dL   GFR, Estimated 36 (L) >60 mL/min    Comment: (NOTE) Calculated using the CKD-EPI Creatinine Equation (2021)    Anion gap 13 5 - 15    Comment: Performed at [facility], [address], [place], [place] [address]  Magnesium      Status: Abnormal   Collection Time: 08/24/24  9:50 PM  Result Value Ref Range   Magnesium  1.3 (L) 1.7 - 2.4 mg/dL    Comment: Performed at [facility],  [address] [place], [place] [address]  Resp panel by RT-PCR (RSV, Flu A&B, Covid) Anterior Nasal Swab     Status: None    Collection Time: 08/24/24  9:58 PM   Specimen: Anterior Nasal Swab  Result Value Ref Range   SARS Coronavirus 2 by RT PCR NEGATIVE NEGATIVE   Influenza A by PCR NEGATIVE NEGATIVE   Influenza B by PCR NEGATIVE NEGATIVE    Comment: (NOTE) The Xpert Xpress SARS-CoV-2/FLU/RSV plus assay is intended as an aid in the diagnosis of influenza from Nasopharyngeal swab specimens and should not be used as a sole basis for treatment. Nasal washings and aspirates are unacceptable for Xpert Xpress SARS-CoV-2/FLU/RSV testing.  Fact Sheet for Patients: BloggerCourse.com  Fact Sheet for Healthcare Providers: SeriousBroker.it  This test is not yet approved or cleared by the [place]  FDA and has been authorized for detection and/or diagnosis of SARS-CoV-2 by FDA under an Emergency Use Authorization (EUA). This EUA will remain in effect (meaning this test can be used) for the duration of the COVID-19 declaration under Section 564(b)(1) of the Act, 21 U.S.C. section 360bbb-3(b)(1), unless the authorization is terminated or revoked.     Resp Syncytial Virus by PCR NEGATIVE NEGATIVE    Comment: (NOTE) Fact Sheet for Patients: BloggerCourse.com  Fact Sheet for Healthcare Providers: SeriousBroker.it  This test is not yet approved or cleared by the [place]  FDA and has been authorized for detection and/or diagnosis of SARS-CoV-2 by FDA under an Emergency Use Authorization (EUA). This EUA will remain in effect (meaning this test can be used) for the duration of the COVID-19 declaration under Section 564(b)(1) of the Act, 21 U.S.C. section 360bbb-3(b)(1), unless the authorization is terminated or revoked.  Performed at [facility], [address], [place], [place] [address]   I-stat chem 8, ED (not at [facility], [facility] or [facility])     Status: Abnormal   Collection Time: 08/25/24 10:53 AM  Result  Value Ref Range   Sodium 135 135 - 145 mmol/L   Potassium 4.5 3.5 - 5.1 mmol/L   Chloride 103 98 - 111 mmol/L   BUN 30 (H) 8 - 23 mg/dL   Creatinine, Ser 8.29 (H) 0.61 - 1.24 mg/dL   Glucose, Bld 783 (H) 70 - 99 mg/dL    Comment: Glucose reference range applies only to samples taken after fasting for at least 8 hours.   Calcium , Ion 1.15 1.15 - 1.40 mmol/L   TCO2 23 22 - 32 mmol/L   Hemoglobin 12.2 (L) 13.0 - 17.0 g/dL   HCT 63.9 (L) 60.9 - 47.9 %  Urinalysis, Routine w reflex microscopic -Urine, Clean Catch     Status: Abnormal   Collection Time: 08/25/24  1:02 PM  Result Value Ref Range   Color, Urine YELLOW YELLOW   APPearance CLEAR CLEAR   Specific Gravity, Urine 1.019 1.005 - 1.030   pH 5.0 5.0 - 8.0   Glucose, UA 50 (A) NEGATIVE mg/dL   Hgb urine dipstick SMALL (A) NEGATIVE   Bilirubin Urine NEGATIVE NEGATIVE   Ketones, ur NEGATIVE NEGATIVE mg/dL   Protein, ur 899 (A) NEGATIVE mg/dL   Nitrite NEGATIVE NEGATIVE   Leukocytes,Ua NEGATIVE NEGATIVE   RBC / HPF 0-5 0 - 5 RBC/hpf   WBC, UA 0-5 0 - 5 WBC/hpf   Bacteria, UA RARE (A) NONE SEEN   Squamous Epithelial / HPF 0-5 0 - 5 /HPF   Hyaline Casts, UA PRESENT     Comment: Performed at  [facility], [address], [place], [place] [address]  Hemoglobin A1c     Status: Abnormal   Collection Time: 08/25/24  3:11 PM  Result Value Ref Range   Hgb A1c MFr Bld 7.7 (H) 4.8 - 5.6 %    Comment: (NOTE) Diagnosis of Diabetes The following HbA1c ranges recommended by the American Diabetes Association (ADA) may be used as an aid in the diagnosis of diabetes mellitus.  Hemoglobin             Suggested A1C NGSP%              Diagnosis  <5.7                   Non Diabetic  5.7-6.4                Pre-Diabetic  >6.4                   Diabetic  <7.0                   Glycemic control for                       adults with diabetes.     Mean Plasma Glucose 174.29 mg/dL    Comment: Performed at [facility], [address] [place], [place] [address]  RPR     Status: None   Collection Time: 08/25/24  4:05 PM  Result Value Ref Range   RPR Ser Ql NON REACTIVE NON REACTIVE    Comment: Performed at [facility], [address], [place], [place] [address]  HIV Antibody (routine testing w rflx)     Status: None   Collection Time: 08/25/24  4:05 PM  Result Value Ref Range   HIV Screen 4th Generation wRfx Non Reactive Non Reactive    Comment: Performed at [facility], [address], [place], [place] [address]  Vitamin B12     Status: Abnormal   Collection Time: 08/25/24  4:06 PM  Result Value Ref Range   Vitamin B-12 155 (L) 180 - 914 pg/mL    Comment: (NOTE) This assay is not validated for testing neonatal or myeloproliferative syndrome specimens for Vitamin B12 levels. Performed at [facility], [address], [place], [place] [address]   Folate     Status: None   Collection Time: 08/25/24  4:06 PM  Result Value Ref Range   Folate 7.2 >5.9 ng/mL    Comment: Performed at [facility], [address], [place], [place] [address]  TSH     Status: None   Collection Time: 08/25/24  4:06 PM  Result Value Ref Range   TSH 2.314 0.350 - 4.500 uIU/mL    Comment: Performed by a 3rd Generation assay with a functional sensitivity of <=0.01 uIU/mL. Performed at [facility], [address] [place], [place] [address]   Basic metabolic panel     Status: Abnormal   Collection Time: 08/25/24  5:45 PM  Result Value Ref Range   Sodium 133 (L) 135 - 145 mmol/L   Potassium 4.9 3.5 - 5.1 mmol/L   Chloride 104 98 - 111 mmol/L   CO2 15 (L) 22 - 32 mmol/L   Glucose, Bld 248 (H) 70 - 99 mg/dL    Comment: Glucose reference range applies only to samples taken after fasting for at least 8  hours.   BUN 27 (H) 8 - 23 mg/dL   Creatinine, Ser 8.32 (H) 0.61 - 1.24 mg/dL   Calcium  8.3 (L) 8.9 - 10.3 mg/dL   GFR, Estimated 42 (L) >60 mL/min    Comment: (NOTE) Calculated using the CKD-EPI  Creatinine Equation (2021)    Anion gap 14 5 - 15    Comment: Performed at [facility], [address], [place], [place] [address]  Glucose, capillary     Status: Abnormal   Collection Time: 08/25/24  6:36 PM  Result Value Ref Range   Glucose-Capillary 198 (H) 70 - 99 mg/dL    Comment: Glucose reference range applies only to samples taken after fasting for at least 8 hours.  Glucose, capillary     Status: Abnormal   Collection Time: 08/25/24  8:07 PM  Result Value Ref Range   Glucose-Capillary 183 (H) 70 - 99 mg/dL    Comment: Glucose reference range applies only to samples taken after fasting for at least 8 hours.  CBC     Status: Abnormal   Collection Time: 08/26/24  4:34 AM  Result Value Ref Range   WBC 5.6 4.0 - 10.5 K/uL   RBC 4.09 (L) 4.22 - 5.81 MIL/uL   Hemoglobin 12.1 (L) 13.0 - 17.0 g/dL   HCT 63.6 (L) 60.9 - 47.9 %   MCV 88.8 80.0 - 100.0 fL   MCH 29.6 26.0 - 34.0 pg   MCHC 33.3 30.0 - 36.0 g/dL   RDW 86.9 88.4 - 84.4 %   Platelets 175 150 - 400 K/uL   nRBC 0.0 0.0 - 0.2 %    Comment: Performed at [facility], [address], [place], [place] [address]  Basic metabolic panel with GFR     Status: Abnormal   Collection Time: 08/26/24  4:34 AM  Result Value Ref Range   Sodium 133 (L) 135 - 145 mmol/L   Potassium 4.5 3.5 - 5.1 mmol/L   Chloride 100 98 - 111 mmol/L   CO2 18 (L) 22 - 32 mmol/L   Glucose, Bld 167 (H) 70 - 99 mg/dL    Comment: Glucose reference range applies only to samples taken after fasting for at least 8 hours.   BUN 22 8 - 23 mg/dL   Creatinine, Ser 8.55 (H) 0.61 - 1.24 mg/dL   Calcium  8.5 (L) 8.9 - 10.3 mg/dL   GFR, Estimated 51 (L) >60 mL/min    Comment: (NOTE) Calculated using the CKD-EPI Creatinine Equation (2021)    Anion gap 15 5 - 15    Comment: Performed at [facility], [address], [place], [place] [address]  Magnesium      Status: None   Collection Time: 08/26/24  4:34 AM  Result Value Ref Range   Magnesium  2.0  1.7 - 2.4 mg/dL    Comment: Performed at [facility], [address] [place], [place] [address]  Uric acid     Status: None   Collection Time: 08/26/24  4:34 AM  Result Value Ref Range   Uric Acid, Serum 7.5 3.7 - 8.6 mg/dL    Comment: Performed at [facility], [address], [place], [place] [address]  Glucose, capillary     Status: Abnormal   Collection Time: 08/26/24  8:00 AM  Result Value Ref Range   Glucose-Capillary 163 (H) 70 - 99 mg/dL    Comment: Glucose reference range applies only to samples taken after fasting for  at least 8 hours.  Glucose, capillary     Status: Abnormal   Collection Time: 08/26/24 11:59 AM  Result Value Ref Range   Glucose-Capillary 237 (H) 70 - 99 mg/dL    Comment: Glucose reference range applies only to samples taken after fasting for at least 8 hours.    MR THORACIC SPINE W WO CONTRAST Result Date: 08/25/2024 CLINICAL DATA:  Myelopathy, chronic, thoracic spine. Bilateral lower extremity weakness. EXAM: MRI THORACIC WITHOUT AND WITH CONTRAST TECHNIQUE: Multiplanar and multiecho pulse sequences of the thoracic spine were obtained without and with intravenous contrast. CONTRAST:  10mL GADAVIST  GADOBUTROL  1 MMOL/ML IV SOLN COMPARISON:  None Available. FINDINGS: Axial T2 spin echo and gradient echo sequences are moderately to severely motion degraded. Alignment: Normal. Vertebrae: No fracture or suspicious marrow lesion. Very mild edema in the T2 posterior elements which is nonspecific but may be related to mild T1-2 facet arthritis. Cord:  Normal cord signal.  No abnormal intradural enhancement. Paraspinal and other soft tissues: Unremarkable. Disc levels: At T8-9, a small right paracentral disc protrusion and moderate right greater than left facet and ligamentum flavum hypertrophy result in mild spinal stenosis. Facet and ligamentum flavum hypertrophy also result in mild spinal stenosis at T9-10 and T10-11. There is mild bilateral neural foraminal  stenosis at T10-11. IMPRESSION: 1. Motion degraded examination. 2. Mild spinal stenosis at T8-9, T9-10, and T10-11. 3. Normal appearance of the thoracic spinal cord. Electronically Signed   By: Dasie Hamburg M.D.   On: 08/25/2024 17:52   MR LUMBAR SPINE W WO CONTRAST Result Date: 08/25/2024 CLINICAL DATA:  Myelopathy, acute, lumbar spine. Bilateral lower extremity weakness. EXAM: MRI LUMBAR SPINE WITHOUT AND WITH CONTRAST TECHNIQUE: Multiplanar and multiecho pulse sequences of the lumbar spine were obtained without and with intravenous contrast. CONTRAST:  10mL GADAVIST  GADOBUTROL  1 MMOL/ML IV SOLN COMPARISON:  CT abdomen and pelvis 08/25/2024 FINDINGS: Segmentation:  Standard. Alignment:  Normal. Vertebrae: No fracture, suspicious marrow lesion, or significant marrow edema. Conus medullaris and cauda equina: Conus extends to the L1 level. Conus and cauda equina appear normal. Paraspinal and other soft tissues: Unremarkable. Disc levels: Disc desiccation throughout the lumbar spine. Mild disc space narrowing at L3-4 and L4-5. T12-L1: Mild facet hypertrophy without disc herniation or stenosis. L1-2: Mild facet hypertrophy without disc herniation or stenosis. L2-3: Mild disc bulging, congenitally short pedicles, mildly prominent epidural fat, and moderate facet and ligamentum flavum hypertrophy result in mild spinal stenosis and mild bilateral neural foraminal stenosis. L3-4: Disc bulging, congenitally short pedicles, mildly prominent epidural fat, and moderate facet hypertrophy result in moderate spinal stenosis, mild bilateral lateral recess stenosis, and mild-to-moderate bilateral neural foraminal stenosis. L4-5: Disc bulging, congenitally short pedicles, mildly prominent epidural fat, and severe facet and ligamentum flavum hypertrophy result in moderate to severe spinal stenosis and moderate bilateral neural foraminal stenosis. L5-S1: Disc bulging eccentric to the left and moderate to severe facet hypertrophy  result in mild right and moderate to severe left neural foraminal stenosis without spinal stenosis. IMPRESSION: 1. 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 2. Moderate to severe left neural foraminal stenosis at L5-S1. 3. Moderate bilateral neural foraminal stenosis at L4-5. Electronically Signed   By: Dasie Hamburg M.D.   On: 08/25/2024 17:45   CT ABDOMEN PELVIS W CONTRAST Result Date: 08/25/2024 EXAM: CT ABDOMEN AND PELVIS WITH CONTRAST 08/25/2024 06:55:58 AM TECHNIQUE: CT of the abdomen and pelvis  was performed with the administration of intravenous contrast (75mL iohexol  (OMNIPAQUE ) 350 MG/ML injection). Multiplanar reformatted images are provided for review. Automated exposure control, iterative reconstruction, and/or weight-based adjustment of the mA/kV was utilized to reduce the radiation dose to as low as reasonably achievable. COMPARISON: CT of the abdomen and pelvis dated 01/17/2000. CLINICAL HISTORY: LLQ abdominal pain. FINDINGS: LOWER CHEST: No acute abnormality. LIVER: The liver is unremarkable. GALLBLADDER AND BILE DUCTS: Status post interval cholecystectomy. SPLEEN: No acute abnormality. PANCREAS: No acute abnormality. ADRENAL GLANDS: No acute abnormality. KIDNEYS, URETERS AND BLADDER: No stones in the kidneys or ureters. No hydronephrosis. No perinephric or periureteral stranding. Urinary bladder is unremarkable. GI AND BOWEL: Numerous colonic diverticula present. Stomach demonstrates no acute abnormality. There is no bowel obstruction. PERITONEUM AND RETROPERITONEUM: No ascites. No free air. Placement of a ventriculoperitoneal shunt on the right. VASCULATURE: Mild-to-moderate calcific atheromatous disease within the abdominal aorta. LYMPH NODES: No lymphadenopathy. REPRODUCTIVE ORGANS: No acute abnormality. BONES AND SOFT TISSUES: No acute osseous abnormality. No focal  soft tissue abnormality. IMPRESSION: 1. No acute findings in the abdomen or pelvis related to LLQ abdominal pain. 2. Numerous colonic diverticula without evidence of diverticulitis. Electronically signed by: Evalene Coho MD 08/25/2024 07:04 AM EDT RP Workstation: [id number]   DG Chest 2 View Result Date: 08/24/2024 EXAM: 2 VIEW(S) XRAY OF THE CHEST 08/24/2024 11:29:00 PM COMPARISON: None available. CLINICAL HISTORY: Vomiting, confusion. Reports headache with nausea and vomiting. Patient threw up in the parking lot. Daughter states today he called her at and stated yesterday he lost the ability to stand. Patient is also incontient and has been unable to hold anything down. Patient has a shunt for hydrocephalus. FINDINGS: LINES, TUBES AND DEVICES: Shunt catheter tubing in right chest. LUNGS AND PLEURA: No focal pulmonary opacity. No pulmonary edema. No pleural effusion. No pneumothorax. HEART AND MEDIASTINUM: Sternal wires with normal cardiac silhouette. Postsurgical changes from CABG. BONES AND SOFT TISSUES: No acute osseous abnormality. IMPRESSION: 1. No acute process. 2. Shunt catheter tubing in right chest. Electronically signed by: Dorethia Molt MD 08/24/2024 11:36 PM EDT RP Workstation: [id number]   CT Head Wo Contrast Result Date: 08/24/2024 CLINICAL DATA:  Altered mental status EXAM: CT HEAD WITHOUT CONTRAST TECHNIQUE: Contiguous axial images were obtained from the base of the skull through the vertex without intravenous contrast. RADIATION DOSE REDUCTION: This exam was performed according to the departmental dose-optimization program which includes automated exposure control, adjustment of the mA and/or kV according to patient size and/or use of iterative reconstruction technique. COMPARISON:  08/29/2023 FINDINGS: Brain: Extra-axial fluid collection is again identified on the right. It measures approximately 10 mm in greatest thickness and is significantly decreased when compared with the prior  exam. No hyperdense component to suggest acute hemorrhage is identified. No significant midline shift is noted. No acute hemorrhage or acute infarct is seen. Ventriculostomy catheter is noted on the right extending into the left lateral ventricle stable from the prior exam. Vascular: No hyperdense vessel or unexpected calcification. Skull: Normal. Negative for fracture or focal lesion. Sinuses/Orbits: No acute finding. Other: None. IMPRESSION: Significant decrease in right-sided subdural hematoma when compared with the prior exam. No acute component is seen. No midline shift is noted. Ventriculostomy catheter in satisfactory position. No other focal abnormality is noted. Electronically Signed   By: Oneil Devonshire M.D.   On: 08/24/2024 23:10    Review of Systems  HENT:  Negative for ear discharge, ear pain, hearing loss and tinnitus.   Eyes:  Negative for photophobia  and pain.  Respiratory:  Negative for cough and shortness of breath.   Cardiovascular:  Negative for chest pain.  Gastrointestinal:  Negative for abdominal pain, nausea and vomiting.  Genitourinary:  Negative for dysuria, flank pain, frequency and urgency.  Musculoskeletal:  Positive for arthralgias (Right elbow, bilateral feet). Negative for back pain, myalgias and neck pain.  Neurological:  Negative for dizziness and headaches.  Hematological:  Does not bruise/bleed easily.  Psychiatric/Behavioral:  The patient is not nervous/anxious.    Blood pressure 134/70, pulse 77, temperature 97.8 F (36.6 C), resp. rate 17, height 5' 8 (1.727 m), weight 104.3 kg, SpO2 97%. Physical Exam Constitutional:      General: He is not in acute distress.    Appearance: He is well-developed. He is not diaphoretic.  HENT:     Head: Normocephalic and atraumatic.  Eyes:     General: No scleral icterus.       Right eye: No discharge.        Left eye: No discharge.     Conjunctiva/sclera: Conjunctivae normal.  Cardiovascular:     Rate and Rhythm:  Normal rate and regular rhythm.  Pulmonary:     Effort: Pulmonary effort is normal. No respiratory distress.  Musculoskeletal:     Cervical back: Normal range of motion.     Comments: Right shoulder, elbow, wrist, digits- no skin wounds, olecranon bursa swollen, erythematous, mild TTP, no warmth, no instability, no blocks to motion  Sens  Ax/R/M/U intact  Mot   Ax/ R/ PIN/ M/ AIN/ U intact  Rad 2+  Skin:    General: Skin is warm and dry.  Neurological:     Mental Status: He is alert.  Psychiatric:        Mood and Affect: Mood normal.        Behavior: Behavior normal.     Assessment/Plan: Right olecranon bursitis -- I favor gouty bursitis given history. As acute gout treatment was only started this morning would recommend waiting 48h to see if it's effective. Reaspiration would put him at risk for iatrogenic septic bursitis or fistula formation so would reserve that for treatment failure.    Ozell Micheal Ned, PA-C Orthopedic Surgery [phone] 08/26/2024, 12:49 PM

## 2024-08-26 NOTE — Progress Notes (Signed)
 Transition of Care [facility]) - Inpatient Brief Assessment   Patient Details  Name: Micheal Duncan MRN: [id number] Date of Birth: Apr 14, 1946  Transition [facility] CM/SW Contact:    Rosaline JONELLE Joe, RN Phone Number: 08/26/2024, 3:27 PM   Clinical Narrative: Patient admitted from home with bilateral lower extremity weakness and nausea.  Patient is currently being followed by CIR for Rehabilitation consult and possible admission.   Transition of Care Asessment: Insurance and Status: (P) Insurance coverage has been reviewed Patient has primary care physician: (P) Yes Home environment has been reviewed: (P) from home Prior level of function:: (P) self Prior/Current Home Services: (P) No current home services Social Drivers of Health Review: (P) SDOH reviewed needs interventions Readmission risk has been reviewed: (P) Yes Transition of care needs: (P) transition of care needs identified, TOC will continue to follow

## 2024-08-26 NOTE — Plan of Care (Signed)

## 2024-08-26 NOTE — Assessment & Plan Note (Addendum)
 One fever overnight of 100.6. Recently finished course of Doxy and Bactrim for Right elbow bursitis on 9/13. CXR on 9/15 without acute pulmonary findings. Suspected source of fever is elbow bursitis. - Blood cultures to be collected - Ortho consulted re: possible need for aspiration - If he fevers again, start antibiotics (IV Vanc and Ceftriaxone )

## 2024-08-26 NOTE — Evaluation (Signed)
 Occupational Therapy Evaluation Patient Details Name: Micheal Duncan MRN: [id number] DOB: [birth year] Today's Date: 08/26/2024   History of Present Illness   Pt is a 78 yr old male who presents 08/24/24 due to B LE weakness and n/v.  MRI showed multiple levels of stenosis. 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 Pt has a daughter who can assist if needed. At this time needs min-mod assist with sit to stand transfers and min-CGA with ambulation and use of RW. He is able to complete UE while sitting at EOB post set up and min mod for LE. Patient will benefit from intensive inpatient follow-up therapy, >3 hours/day.      If plan is discharge home, recommend the following:   A lot of help with walking and/or transfers;A lot of help with bathing/dressing/bathroom;Assistance with cooking/housework;Assist for transportation;Help with stairs or ramp for entrance     Functional Status Assessment   Patient has had a recent decline in their functional status and demonstrates the ability to make significant improvements in function in a reasonable and predictable amount of time.     Equipment Recommendations   BSC/3in1     Recommendations for Other Services   Rehab consult     Precautions/Restrictions   Precautions Precautions: Fall Recall of Precautions/Restrictions: Intact Precaution/Restrictions Comments: Pt reported no recent falls but has felt unsteady and required going to cane to walker Restrictions Weight Bearing Restrictions Per Provider Order: No     Mobility Bed Mobility Overal bed mobility: Needs Assistance Bed Mobility: Supine to Sit     Supine to sit: Contact  guard     General bed mobility comments: Pt reports they sleep in recliner at PLOF    Transfers Overall transfer level: Needs assistance Equipment used: Rolling walker (2 wheels) Transfers: Sit to/from Stand Sit to Stand: Mod assist, Min assist           General transfer comment: on first attempt needed mod assist and then with reapeated then min assist      Balance Overall balance assessment: Needs assistance Sitting-balance support: Feet supported Sitting balance-Leahy Scale: Good     Standing balance support: Bilateral upper extremity supported Standing balance-Leahy Scale: Poor Standing balance comment: due to RLE pain                           ADL either performed or assessed with clinical judgement   ADL Overall ADL's : Needs assistance/impaired Eating/Feeding: Independent;Sitting   Grooming: Set up;Sitting   Upper Body Bathing: Set up;Sitting   Lower Body Bathing: Minimal assistance;Moderate assistance;Sit to/from stand   Upper Body Dressing : Set up;Sitting   Lower Body Dressing: Minimal assistance;Moderate assistance;Sit to/from stand   Toilet Transfer: Moderate assistance;Cueing for safety;Cueing for sequencing;BSC/3in1;Rolling walker (2 wheels)   Toileting- Clothing Manipulation and Hygiene: Moderate assistance;Maximal assistance;Sit to/from stand       Functional mobility during ADLs: Contact guard assist;Minimal assistance;Rolling walker (2 wheels);Cueing for sequencing;Cueing for safety General ADL Comments: Min assist intially in small spaces     Vision         Perception         Praxis  Pertinent Vitals/Pain Pain Assessment Pain Assessment: 0-10 Pain Score: 8  Pain Location: RLE Pain Descriptors / Indicators: Aching, Discomfort, Dull, Grimacing, Guarding Pain Intervention(s): Limited activity within patient's tolerance, Monitored during session, Repositioned     Extremity/Trunk Assessment Upper Extremity  Assessment Upper Extremity Assessment: Overall WFL for tasks assessed (some stifness but WFL to complete ADLS)   Lower Extremity Assessment Lower Extremity Assessment: Defer to PT evaluation   Cervical / Trunk Assessment Cervical / Trunk Assessment: Kyphotic   Communication Communication Communication: No apparent difficulties Factors Affecting Communication: Hearing impaired   Cognition Arousal: Alert Behavior During Therapy: WFL for tasks assessed/performed               OT - Cognition Comments: Pt slightly slow to answer questions but also HOH                 Following commands: Intact       Cueing  General Comments   Cueing Techniques: Verbal cues      Exercises     Shoulder Instructions      Home Living Family/patient expects to be discharged to:: Private residence Living Arrangements: Alone Available Help at Discharge: Available PRN/intermittently (However, daughter reported they might be able to live in with pt or they can come to their house if needed.) Type of Home: House Home Access: Stairs to enter;Ramped entrance Entrance Stairs-Number of Steps: 1   Home Layout: Multi-level;Able to live on main level with bedroom/bathroom Alternate Level Stairs-Number of Steps: a couple of steps   Bathroom Shower/Tub: Producer, television/film/video: Standard Bathroom Accessibility: Yes How Accessible: Accessible via walker Home Equipment: Rolling Walker (2 wheels);Rollator (4 wheels);Cane - single point;Shower seat - built in          Prior Functioning/Environment Prior Level of Function : Independent/Modified Independent             Mobility Comments: Reported recently needing to go from cane to use of walker as started to decline in mobility and difficulties with transfers ADLs Comments: indep in ADLs, has a housekeeper, driving but not on highways    OT Problem List: Decreased strength;Decreased activity tolerance;Impaired balance  (sitting and/or standing);Decreased safety awareness;Decreased knowledge of use of DME or AE;Pain   OT Treatment/Interventions: Self-care/ADL training;Therapeutic exercise;DME and/or AE instruction;Therapeutic activities;Patient/family education;Balance training      OT Goals(Current goals can be found in the care plan section)   Acute Rehab OT Goals Patient Stated Goal: to do what I can OT Goal Formulation: With patient Time For Goal Achievement: 09/09/24 Potential to Achieve Goals: Good   OT Frequency:  Min 2X/week    Co-evaluation              AM-PAC OT 6 Clicks Daily Activity     Outcome Measure Help from another person eating meals?: None Help from another person taking care of personal grooming?: None Help from another person toileting, which includes using toliet, bedpan, or urinal?: A Lot Help from another person bathing (including washing, rinsing, drying)?: A Lot Help from another person to put on and taking off regular upper body clothing?: A Little Help from another person to put on and taking off regular lower body clothing?: A Lot 6 Click Score: 17   End of Session Equipment Utilized During Treatment: Gait belt;Rolling walker (2 wheels) Nurse Communication: Mobility status  Activity Tolerance: Patient limited by pain Patient left: in chair;with call bell/phone within reach;with family/visitor present (told family to turn on chair alarm  if leaving)  OT Visit Diagnosis: Unsteadiness on feet (R26.81);Other abnormalities of gait and mobility (R26.89);Repeated falls (R29.6);Muscle weakness (generalized) (M62.81);Pain Pain - Right/Left: Right Pain - part of body: Ankle and joints of foot                Time: 9074-9042 OT Time Calculation (min): 32 min Charges:  OT General Charges $OT Visit: 1 Visit OT Evaluation $OT Eval Low Complexity: 1 Low OT Treatments $Self Care/Home Management : 8-22 mins  Warrick POUR OTR/L  Acute Rehab Services  [phone] office  number   Warrick Berber 08/26/2024, 10:10 AM

## 2024-08-26 NOTE — Assessment & Plan Note (Signed)
 CAD: Continuing home ASA 81 mg, Plavix  75 mg, Imdur  30 mg COPD: Per patient, taking inhalers prn; holding inhalers for now, consider restarting as indicated HTN: Continuing home Toprol  25mg , holding lisinopril  10mg  in setting of AKI HLD: Continuing home Lipitor  80 mg GERD: Continuing home Protonix  40 mg OSA: Not on CPAP; consider starting if hypoxic overnight/in morning Depression/PTSD: Continuing home bupropion  75 mg, Prozac  20 mg, prazosin  4 mg at bedtime

## 2024-08-26 NOTE — Assessment & Plan Note (Addendum)
 Holding home medications (Insulin  70/30 [50u in the morning & 50u in the evening], Metformin  500 mg daily, Semaglutide  1 mg weekly [on Wed]). - SSI, consider adding add on Lantus  prn as he tolerates PO intake and sugars increase

## 2024-08-26 NOTE — Assessment & Plan Note (Addendum)
 B12 of 155, ordered for B12 injection which was not given 9/16. Hgb of 12.1, normal MCV. - B12 1000 mcg injection today - Will then do daily B12 1000 mcg injections while inpatient - Then consider switch to B12 1000 mcg injection weekly for 4 weeks upon discharge - Maintenance with po B12 1000 mcg daily following - Goal of above 400 at the minimum per neurology.

## 2024-08-26 NOTE — Progress Notes (Signed)

## 2024-08-26 NOTE — Assessment & Plan Note (Signed)
 One fever overnight of 100.6. Recently finished course of Doxy and Bactrim for Right elbow bursitis on 9/13. CXR on 9/15 without acute pulmonary findings. Suspected source of fever is elbow bursitis. - Blood cultures to be collected - Ortho consulted re: possible need for aspiration - If he fevers again, start antibiotics (IV Vanc and Ceftriaxone )

## 2024-08-26 NOTE — Progress Notes (Addendum)
 Daily Progress Note Intern Pager: [phone]  Patient name: Micheal Duncan Medical record number: [id number] Date of birth: Oct 16, 1946 Age: 78 y.o. Gender: male  Primary Care Provider: [facility] Consultants: Neurology, neurosurgery (signed-off 9/17) Code Status: Full  Pt Overview and Major Events to Date:  - 9/16: Admitted  Medical Decision Making:  Micheal Duncan is a 78 y.o. 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 Did have a low B12 level and ordered B12 injection once, not yet given . To consider dosing today. Also with bilateral foot/ankle pain and hx of gout, treating with colchicine .  Did fever ~midnight up to 100.69F, resolved this am. WBC count of 5.6. Will continue to follow fever curve. Collecting blood cultures. No indication for antibiotics at this time but low threshold to start if fevers again. Assessment & Plan Weakness of both legs Suspicious for B12 deficiency etiology; see below for management. Also considering sequelae of NPH s/p VP shunt; neuro + neurosurgery to consult. - Neurosurgery consulted about VP shunt in place prior to MRI - Adjusted VP shunt settings - Plan for outpatient follow-up with neurosurgery - Neurology consulted, appreciate recs - Fall and delirium precautions in place - PT/OT following - AM Labs: BMP, CBC B12 deficiency B12 of 155, ordered for B12 injection which was not given 9/16. Hgb of 12.1, normal MCV. - B12 1000 mcg injection today - Will then do daily B12 1000 mcg injections while inpatient - Then consider switch to B12 1000 mcg injection weekly for 4 weeks upon discharge - Maintenance with po B12 1000 mcg daily following - Goal of  above 400 at the minimum per neurology. Fever Bursitis of right elbow One fever overnight of 100.6. Recently finished course of Doxy and Bactrim for Right elbow bursitis on 9/13. CXR on 9/15 without acute pulmonary findings. Suspected source of fever is elbow bursitis. - Blood cultures to be collected - Ortho consulted re: possible need for aspiration - If he fevers again, start antibiotics (IV Vanc and Ceftriaxone ) Bilateral ankle pain Normal uric acid level of 7.5. - Colchicine  1.2 mg today, followed by Colchicine  0.6 mg daily starting tom 9/18 AKI (acute kidney injury) (HCC) Cr today of 1.44. Baseline around 1.2.  - Improving with PO intake, no indication for mIVF at this time - Will continue to trend Cr. - Holding Lisinopril  in setting of AKI, consider restarting tomorrow 9/18 T2DM (type 2 diabetes mellitus) (HCC) Holding home medications (Insulin  70/30 [50u in the morning & 50u in the evening], Metformin  500 mg daily, Semaglutide  1 mg weekly [on Wed]). - SSI, consider adding add on Lantus  prn as he tolerates PO intake and sugars increase N&V (nausea and vomiting) (Resolved: 08/26/2024) None reported today. Chronic health problem CAD: Continuing home ASA 81 mg, Plavix  75 mg, Imdur  30 mg COPD: Per patient, taking inhalers prn; holding inhalers for now, consider restarting as indicated HTN: Continuing home Toprol  25mg , holding lisinopril  10mg  in setting of AKI HLD: Continuing home Lipitor  80 mg GERD: Continuing home Protonix  40 mg OSA: Not on CPAP; consider starting if hypoxic overnight/in morning Depression/PTSD: Continuing home bupropion  75 mg, Prozac  20 mg, prazosin  4 mg at bedtime  FEN/GI: Heart healthy PPx: Lovenox  Dispo: Pending PT recommendations  pending clinical improvement . Barriers include improvement in bilateral lower extremity weakness.   Subjective:  No overnight events.  Today, patient reports his pain and weakness are a little bit improved from yesterday. He  still has bilateral foot/ankle pain and weakness in his lower extremities. No other concerns at this time. Daughter was present with him in room, and included in discussion.  Objective: Temp:  [97.6 F (36.4 C)-100.6 F (38.1 C)] 97.8 F (36.6 C) (09/17 0801) Pulse Rate:  [77-91] 77 (09/17 0801) Resp:  [17-25] 17 (09/17 0801) BP: (119-174)/(49-78) 134/70 (09/17 0801) SpO2:  [96 %-99 %] 97 % (09/17 0801) Physical Exam: General: Pt is lying down in bed, no acute distress. Cardiovascular: Regular rate and rhythm, no murmurs/rubs/gallops. Respiratory: Normal work of breathing on room air. Clear to auscultation bilaterally; very minimal wheezing in lower lobes; no crackles. Abdomen: Bowel sounds present and normoactive bilaterally. Soft, nondistended, nontender. Extremities: No bilateral lower extremity edema. Reflexes 1+ in bilateral knees. Pain to palpation of anterior foot/ankle, mild erythema and warmth. DP pulses 2+ bilaterally. Right elbow erythematous, edematous.  Laboratory: Most recent CBC Lab Results  Component Value Date   WBC 5.6 08/26/2024   HGB 12.1 (L) 08/26/2024   HCT 36.3 (L) 08/26/2024   MCV 88.8 08/26/2024   PLT 175 08/26/2024   Most recent BMP    Latest Ref Rng & Units 08/26/2024    4:34 AM  BMP  Glucose 70 - 99 mg/dL 832   BUN 8 - 23 mg/dL 22   Creatinine 9.38 - 1.24 mg/dL 8.55   Sodium 864 - 854 mmol/L 133   Potassium 3.5 - 5.1 mmol/L 4.5   Chloride 98 - 111 mmol/L 100   CO2 22 - 32 mmol/L 18   Calcium  8.9 - 10.3 mg/dL 8.5   Mag: 2.0 0/82 Uric acid: 7.5  9/16 B12: 155 [low] Folate: 7.2 HIV: Non reactive RPR: Non reactive A1c: 7.7 TSH 2.314  Imaging/Diagnostic Tests: 9/16 MRI: 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

## 2024-08-26 NOTE — Assessment & Plan Note (Addendum)
 Suspicious for B12 deficiency etiology; see below for management. Also considering sequelae of NPH s/p VP shunt; neuro + neurosurgery to consult. - Neurosurgery consulted about VP shunt in place prior to MRI - Adjusted VP shunt settings - Plan for outpatient follow-up with neurosurgery - Neurology consulted, appreciate recs - Fall and delirium precautions in place - PT/OT following - AM Labs: BMP, CBC

## 2024-08-26 NOTE — Plan of Care (Signed)
 Patient seen and examined by Dr. Merrianne overnight. Multifactorial lower extremity weakness and gait disturbance Thoracic and lumbar spine MRI completed and also reviewed by neurosurgery-no acute findings.  Needs outpatient neurosurgical follow-up. No further inpatient workup at this time.  Continue replating B12 for goal above 400 at the minimum. Follow-up with outpatient neurosurgery as recommended by neurosurgery Please call back with questions as needed -- Eligio Lav, MD Neurologist [facility]

## 2024-08-27 DIAGNOSIS — M109 Gout, unspecified: Secondary | ICD-10-CM | POA: Insufficient documentation

## 2024-08-27 DIAGNOSIS — G63 Polyneuropathy in diseases classified elsewhere: Secondary | ICD-10-CM | POA: Diagnosis not present

## 2024-08-27 DIAGNOSIS — E538 Deficiency of other specified B group vitamins: Secondary | ICD-10-CM | POA: Diagnosis not present

## 2024-08-27 DIAGNOSIS — R29898 Other symptoms and signs involving the musculoskeletal system: Secondary | ICD-10-CM | POA: Diagnosis not present

## 2024-08-27 LAB — CBC
HCT: 34.6 % — ABNORMAL LOW (ref 39.0–52.0)
Hemoglobin: 11.7 g/dL — ABNORMAL LOW (ref 13.0–17.0)
MCH: 29.7 pg (ref 26.0–34.0)
MCHC: 33.8 g/dL (ref 30.0–36.0)
MCV: 87.8 fL (ref 80.0–100.0)
Platelets: 169 K/uL (ref 150–400)
RBC: 3.94 MIL/uL — ABNORMAL LOW (ref 4.22–5.81)
RDW: 12.8 % (ref 11.5–15.5)
WBC: 4.4 K/uL (ref 4.0–10.5)
nRBC: 0 % (ref 0.0–0.2)

## 2024-08-27 LAB — GLUCOSE, CAPILLARY
Glucose-Capillary: 174 mg/dL — ABNORMAL HIGH (ref 70–99)
Glucose-Capillary: 276 mg/dL — ABNORMAL HIGH (ref 70–99)
Glucose-Capillary: 173 mg/dL — ABNORMAL HIGH (ref 70–99)
Glucose-Capillary: 173 mg/dL — ABNORMAL HIGH (ref 70–99)

## 2024-08-27 LAB — BASIC METABOLIC PANEL WITH GFR
Anion gap: 10 (ref 5–15)
BUN: 25 mg/dL — ABNORMAL HIGH (ref 8–23)
CO2: 19 mmol/L — ABNORMAL LOW (ref 22–32)
Calcium: 8.4 mg/dL — ABNORMAL LOW (ref 8.9–10.3)
Chloride: 104 mmol/L (ref 98–111)
Creatinine, Ser: 1.35 mg/dL — ABNORMAL HIGH (ref 0.61–1.24)
GFR, Estimated: 55 mL/min — ABNORMAL LOW (ref >60)
Glucose, Bld: 175 mg/dL — ABNORMAL HIGH (ref 70–99)
Potassium: 4.2 mmol/L (ref 3.5–5.1)
Sodium: 133 mmol/L — ABNORMAL LOW (ref 135–145)

## 2024-08-27 MED ORDER — INSULIN GLARGINE 100 UNIT/ML ~~LOC~~ SOLN
10.0000 [IU] | Freq: Every day | SUBCUTANEOUS | Status: DC
  Administered 2024-08-27 – 2024-08-28 (×2): 10 [IU] via SUBCUTANEOUS
  Filled 2024-08-27 (×2): qty 0.1

## 2024-08-27 NOTE — Assessment & Plan Note (Addendum)
 Holding home medications (Insulin  70/30 [50u in the morning & 50u in the evening], Metformin  500 mg daily, Semaglutide  1 mg weekly [on Wed]). - On SSI - Add Lantus  10u daily given increased SSI needs

## 2024-08-27 NOTE — Discharge Instructions (Addendum)
 Dear Lonell BROCKS Castor,  Thank you for letting us  participate in your care. You were hospitalized for bilateral lower extremity weakness and diagnosed with Vitamin B12 deficiency neuropathy (HCC). You were treated with Vitamin B12 injections daily while hospitalized, we will transition this to weekly upon discharge for the next 4 weeks. After completing this for 4 weeks you will transition to oral Vitamin B12 1000 mcg daily.  You were also found to have a gout flare during this admission.  We treated this with colchicine , which you will continue outpatient.  We are also starting you on a medication called allopurinol , to take alongside the colchicine .  Your outpatient PCP can help adjust treatment further as needed.  We decreased your insulin  dosing while you were hospitalized since you were not requiring what you normally do at home. When you go home please take 10 units of your 70/30 insulin  twice a day.  POST-HOSPITAL & CARE INSTRUCTIONS Please administer Vitamin B12 as instructed. Take your colchicine  and allopurinol  daily. Please follow up with your neurosurgeon outpatient for your degenerative spine disease.  Follow-up with your PCP in the [place] on Monday.   DOCTOR'S APPOINTMENT   No future appointments.   Follow-up Information     [facility] Schedule an appointment as soon as possible for a visit.   Contact information: [address] [facility] [place] [address] [phone]                 Take care and be well!  Family Medicine Teaching Service Inpatient Team [place]  [facility]  [address] [place], [place] [address] [phone]

## 2024-08-27 NOTE — Assessment & Plan Note (Addendum)
-   B12 1000 mcg injections daily - Switch to B12 1000 mcg injection weekly for 4 weeks upon discharge - Maintenance with po B12 1000 mcg daily following - Goal of B12 level above 400 at the minimum per neurology - Neurosurgery consulted about VP shunt in place prior to MRI, signed off - Adjusted VP shunt settings - Plan for outpatient follow-up - Neurology consulted, signed off - Fall and delirium precautions in place - PT/OT following - AM Labs: BMP, CBC

## 2024-08-27 NOTE — Assessment & Plan Note (Signed)
-   Colchicine  0.6 mg daily until flare resolves - Consider allopurinol  50 mg daily at discharge for prevention

## 2024-08-27 NOTE — Plan of Care (Signed)

## 2024-08-27 NOTE — Progress Notes (Addendum)
 Daily Progress Note Intern Pager: [phone]  Patient name: Micheal Duncan Medical record number: [id number] Date of birth: 01-18-1946 Age: 78 y.o. Gender: male  Primary Care Provider: [facility] Consultants: Neurology (signed-off 9/17), neurosurgery (signed-off 9/17)  Code Status: Full  Pt Overview and Major Events to Date:  - 9/16: Admitted - 9/17: Started on B12 injections, colchicine   Medical Decision Making:  Micheal Duncan is a 78 y.o. 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 Treated with B12 injections and colchicine  Assessment & Plan Weakness of both legs B12 deficiency - B12 1000 mcg injections daily - Switch to B12 1000 mcg injection weekly for 4 weeks upon discharge - Maintenance with po B12 1000 mcg daily following - Goal of B12 level above 400 at the minimum per neurology - Neurosurgery consulted about VP shunt in place prior to MRI, signed off - Adjusted VP shunt settings - Plan for outpatient follow-up - Neurology consulted, signed off - Fall and delirium precautions in place - PT/OT following - AM Labs: BMP, CBC Fever Bursitis of right elbow Fever overnight 9/17 to 100.6. None since. Ortho consulted 9/17, suspected gouty bursitis and waiting 48 hr to see if gout treatment effective; re-aspiration only if treatment failure. No indication for antibiotics at this point. - Blood cultures: No growth < 24 hours - If he fevers again, start antibiotics (IV Vanc and Ceftriaxone ) Bilateral ankle pain Gout of both feet - Colchicine  0.6 mg daily until flare resolves - Consider allopurinol  50 mg daily at discharge for prevention AKI (acute kidney injury) (HCC) Cr improved to  1.35 today from 1.44. Baseline around 1.2 - Encourage continued po intake - Continue to trend Cr - Holding lisinopril  in setting of AKI, also with softer blood pressures; consider switching to losartan  instead to lower uric acid levels in setting of gout T2DM (type 2 diabetes mellitus) (HCC) Holding home medications (Insulin  70/30 [50u in the morning & 50u in the evening], Metformin  500 mg daily, Semaglutide  1 mg weekly [on Wed]). - On SSI - Add Lantus  10u daily given increased SSI needs Chronic health problem CAD: Continuing home ASA 81 mg, Plavix  75 mg, Imdur  30 mg COPD: Per patient, taking inhalers prn; holding inhalers for now, consider restarting as indicated HTN: Continuing home Toprol  25mg , holding lisinopril  10mg  in setting of AKI; consider switching to losartan  instead to lower uric acid levels in setting of gout HLD: Continuing home Lipitor  80 mg GERD: Continuing home Protonix  40 mg OSA: Not on CPAP Depression/PTSD: Continuing home bupropion  75 mg, Prozac  20 mg, prazosin  4 mg at bedtime  FEN/GI: Heart healthy PPx: Lovenox  Dispo: Pending PT reevaluation and continued clinical improvement, possibly tomorrow 9/19  Subjective:  Patient reports he is doing well overall.  Reports some mild pain still in bilateral feet, but much improved from admission.  Also reports weakness improved.  No nausea/vomiting today. Reports he works with an endocrinologist at the [facility] to manage his diabetes and has been on 70/30 insulin  for a long time, is doing well on this.  Does not report financial reason for being on this insulin . Patient would strongly prefer to return home, and does not want to go to CIR or SNF. Daughter also present at bedside  and participates in discussion today.  Objective: Temp:  [97.7 F (36.5 C)-98.3 F (36.8 C)] 98.2 F (36.8 C) (09/18 0743) Pulse Rate:  [66-74] 66 (09/18 0743) Resp:  [18] 18 (09/18 0743) BP: (121-141)/(57-71) 121/57 (09/18 0743) SpO2:  [97 %-98 %] 97 %  (09/18 0743) Physical Exam: General: Sitting in recliner, no acute distress. Cardiovascular: Regular rate and rhythm, no murmurs/rubs/gallops. Respiratory: Normal work of breathing on room air. Clear to auscultation bilaterally; no wheezes, crackles. Abdomen: Bowel sounds present and normoactive bilaterally. Soft, nondistended, nontender. Extremities: Skin warm, dry. No bilateral lower extremity edema. Sensation equal in bilateral lower extremities. Strength 5/5 in bilateral lower extremities. Mild erythema of bilateral feet/ankles, very mild pain to palpation. Right elbow with erythema, edema, mild pain to palpation (improved from prior).  Laboratory: Most recent CBC Lab Results  Component Value Date   WBC 4.4 08/27/2024   HGB 11.7 (L) 08/27/2024   HCT 34.6 (L) 08/27/2024   MCV 87.8 08/27/2024   PLT 169 08/27/2024   Most recent BMP    Latest Ref Rng & Units 08/27/2024    2:01 AM  BMP  Glucose 70 - 99 mg/dL 824   BUN 8 - 23 mg/dL 25   Creatinine 9.38 - 1.24 mg/dL 8.64   Sodium 864 - 854 mmol/L 133   Potassium 3.5 - 5.1 mmol/L 4.2   Chloride 98 - 111 mmol/L 104   CO2 22 - 32 mmol/L 19   Calcium  8.9 - 10.3 mg/dL 8.4     Larraine Palma, MD 08/27/2024, 11:07 AM  PGY-1, [facility] FPTS Intern pager: [phone], text pages welcome Secure chat group [facility] Teaching Service

## 2024-08-27 NOTE — Assessment & Plan Note (Addendum)
 Cr improved to 1.35 today from 1.44. Baseline around 1.2 - Encourage continued po intake - Continue to trend Cr - Holding lisinopril  in setting of AKI, also with softer blood pressures; consider switching to losartan  instead to lower uric acid levels in setting of gout

## 2024-08-27 NOTE — Assessment & Plan Note (Addendum)
-   Colchicine  0.6 mg daily until flare resolves - Consider allopurinol  50 mg daily at discharge for prevention

## 2024-08-27 NOTE — Progress Notes (Signed)
   Inpatient Rehabilitation Admissions Coordinator   Noted therapy recommendations no longer recommend CIR level rehab. We will sign off.  Heron Leavell, RN, MSN Rehab Admissions Coordinator [phone] 08/27/2024 2:26 PM

## 2024-08-27 NOTE — Assessment & Plan Note (Addendum)
 Fever overnight 9/17 to 100.6. None since. Ortho consulted 9/17, suspected gouty bursitis and waiting 48 hr to see if gout treatment effective; re-aspiration only if treatment failure. No indication for antibiotics at this point. - Blood cultures: No growth < 24 hours - If he fevers again, start antibiotics (IV Vanc and Ceftriaxone )

## 2024-08-27 NOTE — Assessment & Plan Note (Addendum)
 CAD: Continuing home ASA 81 mg, Plavix  75 mg, Imdur  30 mg COPD: Per patient, taking inhalers prn; holding inhalers for now, consider restarting as indicated HTN: Continuing home Toprol  25mg , holding lisinopril  10mg  in setting of AKI; consider switching to losartan  instead to lower uric acid levels in setting of gout HLD: Continuing home Lipitor  80 mg GERD: Continuing home Protonix  40 mg OSA: Not on CPAP Depression/PTSD: Continuing home bupropion  75 mg, Prozac  20 mg, prazosin  4 mg at bedtime

## 2024-08-27 NOTE — Progress Notes (Signed)
 Occupational Therapy Treatment Patient Details Name: Micheal Duncan MRN: [id number] DOB: 10/12/1946 Today's Date: 08/27/2024   History of present illness Pt is a 78 yr old male who presents 08/24/24 due to B LE weakness and n/v.  MRI showed multiple levels of stenosis but no acute findings. Pt found to have B12 deficiency. Pt with bilateral foot/ankle pain with ?gout. 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 Pt at this time was able to complete ADLS at sink standing with supervision to CGA and able to don shoes post set up. Pt at this time only required supervision to CGA with RW as does not have good clearance of BLE with ambulation. Spoke to pt and daughter about her assisting with some IADL with the return to home and modification to ADLS. At this time recommendation for [facility] services with continued family support.       If plan is discharge home, recommend the following:  Assistance with cooking/housework;Help with stairs or ramp for entrance   Equipment Recommendations  None recommended by OT (pt no longer wants BSC)    Recommendations for Other Services      Precautions / Restrictions Precautions Precautions: Fall Recall of Precautions/Restrictions: Intact Precaution/Restrictions Comments: Pt reported no recent falls but has felt unsteady and required going to cane to walker Restrictions Weight Bearing Restrictions Per Provider Order: No       Mobility Bed Mobility Overal bed mobility: Modified Independent Bed Mobility: Supine to Sit           General bed mobility comments: mod I for increase in time but was fully supine with no rail use    Transfers Overall transfer level: Needs assistance Equipment used: Rolling walker (2 wheels) Transfers: Sit to/from Stand Sit to Stand:  Modified independent (Device/Increase time)           General transfer comment: due to pain     Balance Overall balance assessment: Needs assistance Sitting-balance support: Feet supported Sitting balance-Leahy Scale: Good     Standing balance support: No upper extremity supported, Bilateral upper extremity supported Standing balance-Leahy Scale: Fair Standing balance comment: able to complete unsupported ADLS at the sink                           ADL either performed or assessed with clinical judgement   ADL Overall ADL's : Needs assistance/impaired Eating/Feeding: Independent;Sitting   Grooming: Set up;Sitting   Upper Body Bathing: Set up;Sitting   Lower Body Bathing: Supervison/ safety;Sit to/from stand   Upper Body Dressing : Set up;Sitting   Lower Body Dressing: Supervision/safety;Sit to/from stand   Toilet Transfer: Supervision/safety;Contact guard assist;Rolling walker (2 wheels)   Toileting- Clothing Manipulation and Hygiene: Supervision/safety;Contact guard assist;Sit to/from stand       Functional mobility during ADLs: Contact guard assist;Rolling walker (2 wheels)      Extremity/Trunk Assessment Upper Extremity Assessment Upper Extremity Assessment: Overall WFL for tasks assessed (some soreness and stiffness but able to complete ADLS)   Lower Extremity Assessment Lower Extremity Assessment: Defer to PT evaluation        Vision       Perception     Praxis     Communication Communication Communication: No apparent difficulties Factors Affecting Communication: Hearing impaired  Cognition Arousal: Alert Behavior During Therapy: WFL for tasks assessed/performed                                 Following commands: Intact        Cueing   Cueing Techniques: Verbal cues  Exercises      Shoulder Instructions       General Comments      Pertinent Vitals/ Pain       Pain Assessment Pain Assessment:  Faces Faces Pain Scale: Hurts little more Pain Location: Bil feet/ankles Pain Descriptors / Indicators: Aching, Dull, Grimacing, Guarding Pain Intervention(s): Limited activity within patient's tolerance, Monitored during session, Repositioned  Home Living                                          Prior Functioning/Environment              Frequency  Min 2X/week        Progress Toward Goals  OT Goals(current goals can now be found in the care plan section)  Progress towards OT goals: Progressing toward goals  Acute Rehab OT Goals Patient Stated Goal: to go home OT Goal Formulation: With patient Time For Goal Achievement: 09/09/24 Potential to Achieve Goals: Good ADL Goals Pt Will Perform Lower Body Bathing: with modified independence;with adaptive equipment;sit to/from stand Pt Will Perform Lower Body Dressing: with modified independence;sitting/lateral leans;with adaptive equipment Pt Will Transfer to Toilet: with supervision;ambulating Pt Will Perform Toileting - Clothing Manipulation and hygiene: with contact guard assist;sit to/from stand Pt Will Perform Tub/Shower Transfer: with contact guard assist;ambulating;shower seat;rolling walker  Plan      Co-evaluation                 AM-PAC OT 6 Clicks Daily Activity     Outcome Measure   Help from another person eating meals?: None Help from another person taking care of personal grooming?: None Help from another person toileting, which includes using toliet, bedpan, or urinal?: None Help from another person bathing (including washing, rinsing, drying)?: A Little Help from another person to put on and taking off regular upper body clothing?: None Help from another person to put on and taking off regular lower body clothing?: None 6 Click Score: 23    End of Session Equipment Utilized During Treatment: Gait belt;Rolling walker (2 wheels)  OT Visit Diagnosis: Unsteadiness on feet  (R26.81);Other abnormalities of gait and mobility (R26.89);Repeated falls (R29.6);Muscle weakness (generalized) (M62.81);Pain Pain - Right/Left: Right Pain - part of body: Ankle and joints of foot   Activity Tolerance Patient tolerated treatment well   Patient Left in chair;with call bell/phone within reach;with family/visitor present   Nurse Communication Mobility status        Time: 1036-1100 OT Time Calculation (min): 24 min  Charges: OT General Charges $OT Visit: 1 Visit OT Treatments $Self Care/Home Management : 23-37 mins  Warrick POUR OTR/L  Acute Rehab Services  [phone] office number   Warrick Berber 08/27/2024, 11:12 AM

## 2024-08-28 ENCOUNTER — Telehealth: Payer: Self-pay

## 2024-08-28 ENCOUNTER — Other Ambulatory Visit (HOSPITAL_COMMUNITY): Payer: Self-pay

## 2024-08-28 ENCOUNTER — Other Ambulatory Visit: Payer: Self-pay | Admitting: Family Medicine

## 2024-08-28 DIAGNOSIS — G63 Polyneuropathy in diseases classified elsewhere: Secondary | ICD-10-CM | POA: Diagnosis not present

## 2024-08-28 DIAGNOSIS — R29898 Other symptoms and signs involving the musculoskeletal system: Secondary | ICD-10-CM | POA: Diagnosis not present

## 2024-08-28 DIAGNOSIS — E538 Deficiency of other specified B group vitamins: Secondary | ICD-10-CM | POA: Diagnosis not present

## 2024-08-28 DIAGNOSIS — M109 Gout, unspecified: Secondary | ICD-10-CM

## 2024-08-28 LAB — GLUCOSE, CAPILLARY
Glucose-Capillary: 197 mg/dL — ABNORMAL HIGH (ref 70–99)
Glucose-Capillary: 181 mg/dL — ABNORMAL HIGH (ref 70–99)
Glucose-Capillary: 201 mg/dL — ABNORMAL HIGH (ref 70–99)

## 2024-08-28 MED ORDER — POLYETHYLENE GLYCOL 3350 17 G PO PACK: 17.0000 g | PACK | Freq: Every day | ORAL | Status: AC | PRN

## 2024-08-28 MED ORDER — CYANOCOBALAMIN 1000 MCG/ML IJ SOLN
1000.0000 ug | INTRAMUSCULAR | 0 refills | Status: AC
  Filled 2024-08-28: qty 4, 28d supply, fill #0

## 2024-08-28 MED ORDER — COLCHICINE 0.6 MG PO TABS: 0.6000 mg | ORAL_TABLET | Freq: Every day | ORAL | 0 refills | Status: AC

## 2024-08-28 MED ORDER — ALLOPURINOL 100 MG PO TABS
50.0000 mg | ORAL_TABLET | Freq: Every day | ORAL | 0 refills | Status: DC
  Filled 2024-08-28: qty 30, 60d supply, fill #0

## 2024-08-28 MED ORDER — INSULIN ASPART PROT & ASPART (70-30 MIX) 100 UNIT/ML ~~LOC~~ SUSP
10.0000 [IU] | Freq: Two times a day (BID) | SUBCUTANEOUS | 0 refills | Status: AC
  Filled 2024-08-28: qty 10, 50d supply, fill #0

## 2024-08-28 MED ORDER — LOSARTAN POTASSIUM 50 MG PO TABS
50.0000 mg | ORAL_TABLET | Freq: Every day | ORAL | 0 refills | Status: AC
  Filled 2024-08-28: qty 30, 30d supply, fill #0

## 2024-08-28 MED ORDER — COLCHICINE 0.6 MG PO TABS
0.6000 mg | ORAL_TABLET | Freq: Every day | ORAL | 0 refills | Status: DC
  Filled 2024-08-28: qty 90, 90d supply, fill #0

## 2024-08-28 MED ORDER — BD LUER-LOK SYRINGE 25G X 1" 3 ML MISC
0 refills | Status: AC
  Filled 2024-08-28: qty 5, 5d supply, fill #0

## 2024-08-28 MED ORDER — ALLOPURINOL 100 MG PO TABS: 50.0000 mg | ORAL_TABLET | Freq: Every day | ORAL | 0 refills | Status: AC

## 2024-08-28 MED ORDER — VITAMIN B-12 100 MCG PO TABS
100.0000 ug | ORAL_TABLET | Freq: Every day | ORAL | 11 refills | Status: AC
  Filled 2024-08-28: qty 30, 30d supply, fill #0

## 2024-08-28 NOTE — Telephone Encounter (Signed)
 Erroneous, please disregard (see prior telephone encounter)

## 2024-08-28 NOTE — Plan of Care (Signed)

## 2024-08-28 NOTE — Discharge Summary (Addendum)
 Family Medicine Teaching [facility] Discharge Summary  Patient name: Micheal Duncan Medical record number: [id number] Date of birth: 1946/07/27 Age: 78 y.o. Gender: male Date of Admission: 08/24/2024  Date of Discharge: 08/28/24 Admitting Physician: Alan Flies, MD  Primary Care Provider: [facility] Consultants: Neurology, neurosurgery  Indication for Hospitalization: bilateral leg weakness and bilateral foot/ankle pain  Discharge Diagnoses/Problem List:  Principal Problem for Admission: Vitamin B12 deficiency, gout Other Problems addressed during stay:  Principal Problem:   Vitamin B12 deficiency neuropathy (HCC) Active Problems:   Weakness of both legs   T2DM (type 2 diabetes mellitus) (HCC)   AKI (acute kidney injury) (HCC), resolved   Bilateral ankle pain suspected 2/2 Gout of both feet   Fever, resolved   Bursitis of right elbow   Brief Hospital Course:  Micheal Duncan is a 78 y.o. 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  Weakness of both legs B12 deficiency CT head without acute findings (known right sided subdural hematoma with decrease size from prior on current CT).  Lab workup mostly unremarkable aside from B12 deficiency-started supplementation with daily 1000 mcg IM injection while inpatient given symptoms. Patient improved with this. Plan for continued course of B12 with weekly 1000 mcg injections for additional 4 weeks, then switch to daily po B12 at 1000 mcg. HHPT was ordered for outpatient therapy. MRI thoracic/lumbar significant for multilevel moderate-severe stenosis, but no spinal cord compression or lesions. Neurology was consulted, and recommended B12 supplementation to goal of >400.  Neurosurgery was consulted and recommended outpatient follow-up;  also adjusted VP shunt settings.  Bilateral ankle pain likely 2/2 gout Patient reported history of gout. Uric acid level 7.5. Trial of colchicine  with improvement in pain. Discharged on colchicine  0.6 mg daily, allopurinol  50 mg daily (reduced dose given decreased kidney function/GFR) for 1-3 months per improvement and PCP.  Fever Right elbow bursitis Did fever up to 100.30F on 9/17 overnight. No elevated WBC count. No subsequent fever. Pt with recent bursitis of right elbow and had finished abx course with Doxy and Bactrim on 9/13. Concern for bursitis as source of fever. Blood cultures collected and showed no growth at 1 day at time of discharge. Ortho consulted re: need for aspiration and did not recommend at this time, suspected gouty bursitis. Antibiotics were not indicated this admission. Continued gout treatment as above.  Acute kidney injury Creatinine of 1.93 on admission, baseline 1.2, likely prerenal.  Patient tolerated p.o., with gradual improvement of renal function. Cr of 1.35 and downtrending at time of discharge.  Type 2 diabetes Held home insulin  70/30, metformin  and semaglutide .  Monitor CBGs with sliding scale and added long-acting insulin  on 9/18 with Lantus  10u daily.  CBGs were well-controlled during hospitalization. Patient returned to reduced home regimen at discharge, 10 units BID 70/30.  Other chronic conditions were medically managed with home medications and formulary alternatives as necessary (CAD, COPD, HTN, HLD, GERD, depression/PTSD)  Follow-up recommendations Recheck BMP (for creatinine) Consider stopping colchicine  in 1-3 months (and continuing allopurinol ); goal uric acid level of <6 Ensure taking B12 supplementation (injections for 4 weeks, then po); goal Vitamin B12 level > 400 Ensure follow-up with neurosurgery for VP shunt management At discharge, switched from lisinopril  to losartan  to help with urate lowering Discharged on reduced dose of home insulin   (10u twice daily 70/30);  titrate up as indicated   Results/Tests Pending at Time of Discharge:  - Blood culture pending  Disposition: Home with plan for outpatient PT  Discharge Condition: Stable  Discharge Exam:  Vitals:   08/28/24 0530 08/28/24 0816  BP: 138/67 (!) 146/69  Pulse: 72 71  Resp:    Temp: 97.7 F (36.5 C) 97.7 F (36.5 C)  SpO2: 100% 96%   Physical Exam Neuro: Patient lying down in bed, no acute distress. Cardiovascular: Regular rate and rhythm, no murmurs/rubs/gallops. Respiratory: Normal work of breathing on room air. Clear to auscultation bilaterally; no wheezes, crackles. Abdomen: Bowel sounds present and normoactive bilaterally. Soft, nondistended, nontender. Extremities: Skin warm, dry. No bilateral lower extremity edema. No erythema of bilateral feet, no pain to palpation. Right elbow with improved edema, erythema, warmth.  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 Moderate to severe left neural foraminal stenosis at L5-S1. Moderate bilateral neural foraminal stenosis at L4-5.  Mild spinal stenosis at T8-9, T9-10, and T10-11. Normal appearance of the thoracic spinal cord.  Discharge Medications:  Allergies as of 08/28/2024       Reactions   Fentanyl  Itching   Confusion, skin crawling   Duloxetine Other (See Comments)   Unknown   Gabapentin Swelling        Medication List     PAUSE taking these medications     methocarbamol  500 MG tablet Wait to take this until your doctor or other care provider tells you to start again. Commonly known as: ROBAXIN  Take 500 mg by mouth in the morning and at bedtime.       STOP taking these medications    doxycycline 100 MG capsule Commonly known as: VIBRAMYCIN   lisinopril  10 MG tablet Commonly known as: ZESTRIL    lisinopril  5 MG tablet Commonly known as: ZESTRIL    sulfamethoxazole-trimethoprim 800-160 MG tablet Commonly known as: BACTRIM DS       TAKE these medications    acetaminophen  500 MG tablet Commonly known as: TYLENOL  Take 500 mg by mouth every 6 (six) hours as needed for mild pain or moderate pain.   albuterol  (2.5 MG/3ML) 0.083% nebulizer solution Commonly known as: PROVENTIL  Take 1 ampule by nebulization every 6 (six) hours as needed (COPD).   albuterol  108 (90 Base) MCG/ACT inhaler Commonly known as: VENTOLIN  HFA Inhale 2 puffs into the lungs 4 (four) times daily as needed for wheezing or shortness of breath.   allopurinol  100 MG tablet Commonly known as: Zyloprim  Take 0.5 tablets (50 mg total) by mouth daily.   aspirin  81 MG chewable tablet Chew 1 tablet (81 mg total) by mouth daily.   atorvastatin  80 MG tablet Commonly known as: LIPITOR  Take 80 mg by mouth at bedtime.   buPROPion  75 MG tablet Commonly known as: WELLBUTRIN  Take 75 mg by mouth daily.   cetirizine 10 MG tablet Commonly known as: ZYRTEC Take 10 mg by mouth at bedtime.   clopidogrel   75 MG tablet Commonly known as: PLAVIX  Take 75 mg by mouth daily.   colchicine  0.6 MG tablet Take 1 tablet (0.6 mg total) by mouth daily.   cyanocobalamin  1000 MCG/ML injection Commonly known as: VITAMIN B12 Inject 1 mL (1,000 mcg total) into the muscle once a week for 28 days. Start taking on: August 29, 2024   vitamin B-12 100 MCG tablet Commonly known as: CYANOCOBALAMIN  Take 1 tablet (100 mcg total) by mouth daily. TAKE AFTER LAST WEEKLY INJECTION OF  VITAMIN B12 Start taking on: September 27, 2024   FLUoxetine  20 MG tablet Commonly known as: PROZAC  Take 20 mg by mouth daily.   fluticasone  50 MCG/ACT nasal spray Commonly known as: FLONASE  Place 2 sprays into both nostrils daily as needed for rhinitis or allergies.   furosemide  40 MG tablet Commonly known as: LASIX  Take 40 mg by mouth daily as needed for fluid.   insulin  aspart protamine- aspart (70-30) 100 UNIT/ML injection Commonly known as: NovoLOG  Mix 70/30 Inject 0.1 mLs (10 Units total) into the skin 2 (two) times daily. What changed: how much to take   isosorbide  mononitrate 30 MG 24 hr tablet Commonly known as: IMDUR  Take 30 mg by mouth daily.   LACTOBACILLUS PO Take 2 capsules by mouth every morning.   losartan  50 MG tablet Commonly known as: Cozaar  Take 1 tablet (50 mg total) by mouth daily.   metFORMIN  500 MG 24 hr tablet Commonly known as: GLUCOPHAGE -XR Take 500 mg by mouth daily.   metoprolol  succinate 25 MG 24 hr tablet Commonly known as: TOPROL -XL Take 25 mg by mouth daily.   mirabegron ER 25 MG Tb24 tablet Commonly known as: MYRBETRIQ Take 25 mg by mouth daily.   montelukast  10 MG tablet Commonly known as: SINGULAIR  Take 10 mg by mouth every morning.   nitroGLYCERIN  0.4 MG SL tablet Commonly known as: NITROSTAT  Place 0.4 mg under the tongue every 5 (five) minutes as needed for chest pain.   omeprazole 20 MG capsule Commonly known as: PRILOSEC Take 40 mg by mouth every morning.   polyethylene glycol 17 g packet Commonly known as: MIRALAX  / GLYCOLAX  Take 17 g by mouth daily as needed for mild constipation.   prazosin  2 MG capsule Commonly known as: MINIPRESS  Take 4 mg by mouth at bedtime.   Semaglutide  (1 MG/DOSE) 4 MG/3ML Sopn Inject 1 mg into the skin once a week. On Wednesday   senna-docusate 8.6-50 MG tablet Commonly known as: Senokot-S Take 2 tablets by mouth every evening.   SYSTANE BALANCE OP Place 1 drop into both eyes 4  (four) times daily as needed (Dry eyes).        Discharge Instructions: Please refer to Patient Instructions section of EMR for full details.  Patient was counseled important signs and symptoms that should prompt return to medical care, changes in medications, dietary instructions, activity restrictions, and follow up appointments.   Follow-Up Appointments:  Follow-up Information     [facility] Schedule an appointment as soon as possible for a visit.   Contact information: [address] [place] [address] [phone]                 Larraine Palma, MD 08/28/2024, 11:22 AM PGY-1, [facility]  I agree with the assessment and plan as documented above.  Stuart Redo, MD PGY-3, [facility]

## 2024-08-28 NOTE — Plan of Care (Addendum)
 Spoke with patient's daughter, per patient's request, about medication changes made prior to discharge.  Informed her we were switching his blood pressure medication from lisinopril  to losartan , starting him on both colchicine  and allopurinol  together, and reducing home insulin  to 10 units twice daily.  Daughter reported that they had been able to get the vitamin B12 injections and the losartan  from the [facility], but she had not been given the colchicine  or the allopurinol .  She was working with another pharmacist to try to get these medications already.  Informed her that patient should not take the allopurinol  without the colchicine .  All questions answered and no indication for repeat orders at this time.  Addendum Dr. Tharon: sent in colchicine  and allopurinol  to [facility] pharmacy in [place] at patient request.

## 2024-08-31 LAB — CULTURE, BLOOD (ROUTINE X 2)
Culture: NO GROWTH
Culture: NO GROWTH
Special Requests: ADEQUATE
Special Requests: ADEQUATE
